# Patient Record
Sex: Male | Born: 1955 | Race: White | Hispanic: No | Marital: Married | Smoking: Never smoker
Health system: Southern US, Community
[De-identification: ages and names within clinical notes are randomized; demographics above are authoritative.]

## PROBLEM LIST (undated history)

## (undated) DIAGNOSIS — Z9889 Other specified postprocedural states: Secondary | ICD-10-CM

## (undated) DIAGNOSIS — D510 Vitamin B12 deficiency anemia due to intrinsic factor deficiency: Secondary | ICD-10-CM

## (undated) DIAGNOSIS — D649 Anemia, unspecified: Secondary | ICD-10-CM

## (undated) DIAGNOSIS — D759 Disease of blood and blood-forming organs, unspecified: Secondary | ICD-10-CM

## (undated) DIAGNOSIS — Z8719 Personal history of other diseases of the digestive system: Secondary | ICD-10-CM

## (undated) DIAGNOSIS — I8500 Esophageal varices without bleeding: Secondary | ICD-10-CM

## (undated) DIAGNOSIS — F1910 Other psychoactive substance abuse, uncomplicated: Secondary | ICD-10-CM

## (undated) DIAGNOSIS — Z9089 Acquired absence of other organs: Secondary | ICD-10-CM

## (undated) HISTORY — DX: Vitamin B12 deficiency anemia due to intrinsic factor deficiency: D51.0

## (undated) HISTORY — PX: LAMINECTOMY: SHX219

## (undated) HISTORY — DX: Other specified postprocedural states: Z98.890

## (undated) HISTORY — DX: Personal history of other diseases of the digestive system: Z87.19

## (undated) HISTORY — PX: TONSILLECTOMY: SUR1361

## (undated) HISTORY — DX: Acquired absence of other organs: Z90.89

---
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
## 1985-06-05 HISTORY — PX: INGUINAL HERNIA REPAIR: SHX194

## 2005-06-12 ENCOUNTER — Encounter: Admission: RE | Admit: 2005-06-12 | Discharge: 2005-06-12 | Payer: Self-pay | Admitting: Orthopedic Surgery

## 2008-06-05 HISTORY — PX: LAMINECTOMY: SHX219

## 2009-02-18 ENCOUNTER — Encounter: Admission: RE | Admit: 2009-02-18 | Discharge: 2009-02-18 | Payer: Self-pay | Admitting: Chiropractic Medicine

## 2009-10-30 ENCOUNTER — Ambulatory Visit (HOSPITAL_COMMUNITY): Admission: RE | Admit: 2009-10-30 | Discharge: 2009-10-30 | Payer: Self-pay | Admitting: Family Medicine

## 2011-11-30 ENCOUNTER — Other Ambulatory Visit: Payer: Self-pay | Admitting: Gastroenterology

## 2011-11-30 DIAGNOSIS — R1310 Dysphagia, unspecified: Secondary | ICD-10-CM

## 2011-12-11 ENCOUNTER — Ambulatory Visit
Admission: RE | Admit: 2011-12-11 | Discharge: 2011-12-11 | Disposition: A | Discharge status: Home / Self Care | Payer: BC Managed Care – PPO | Source: Ambulatory Visit | Attending: Gastroenterology | Admitting: Gastroenterology

## 2011-12-11 DIAGNOSIS — R1310 Dysphagia, unspecified: Secondary | ICD-10-CM

## 2013-12-10 IMAGING — RF DG ESOPHAGUS
13 of 15 series · 19 of 24 positions shown · non-contrast
Comparison: None.

CLINICAL DATA: Intermittent dysphagia.

ESOPHOGRAM / BARIUM SWALLOW / BARIUM TABLET STUDY
TECHNIQUE: Combined double contrast and single contrast
examination performed using effervescent crystals, thick barium
liquid, and thin barium liquid.  The patient was observed with
fluoroscopy swallowing a 13mm barium sulphate tablet.
Fluoroscopy time:  1.1 minutes.

[Series 1: run · 4 of 8 slices shown (1 of 13)]
[im 1/8]
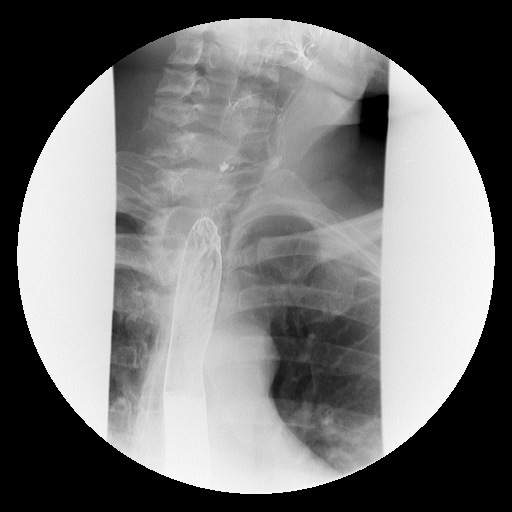
[im 2/8]
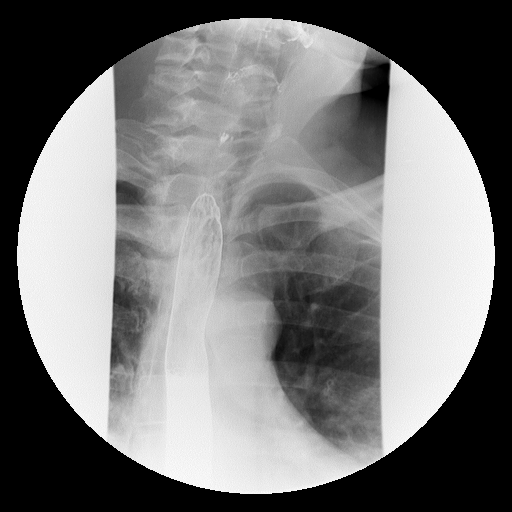
[im 6/8]
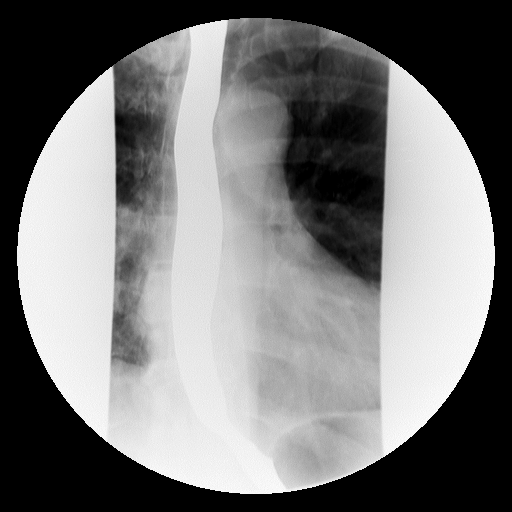
[im 8/8]
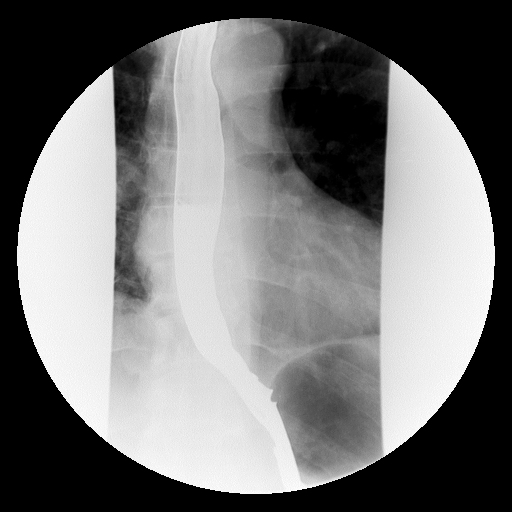

[Series 2: run · 2 of 5 slices shown (2 of 13)]
[im 1/5]
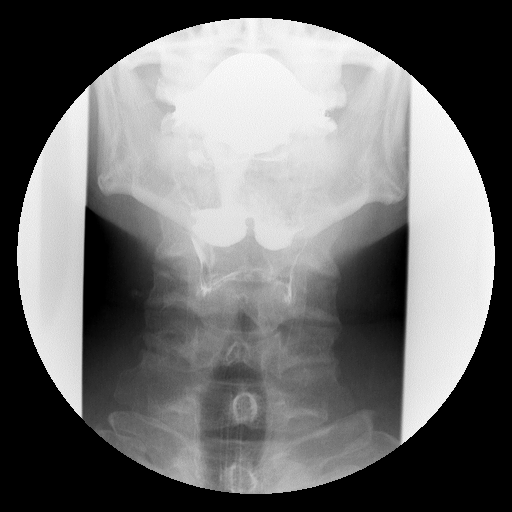
[im 3/5]
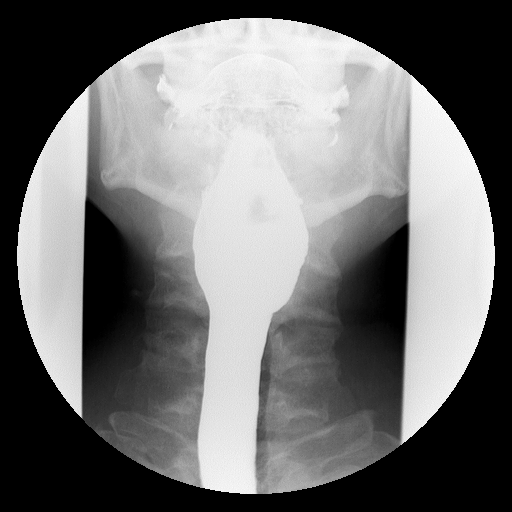

[Series 3: run · 3 of 5 slices shown (3 of 13)]
[im 1/5]
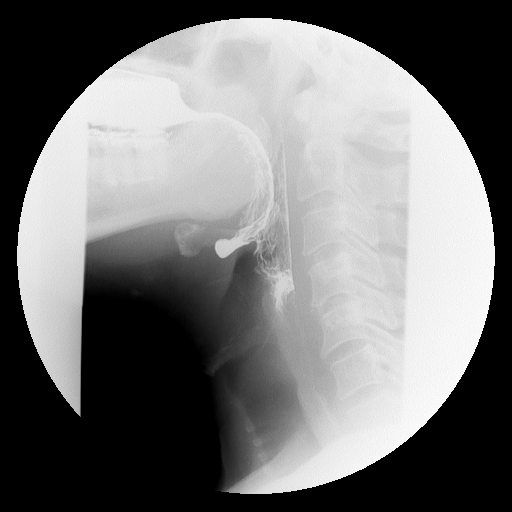
[im 2/5]
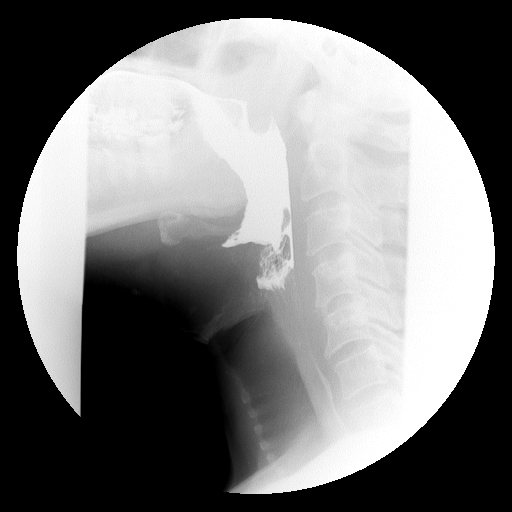
[im 3/5]
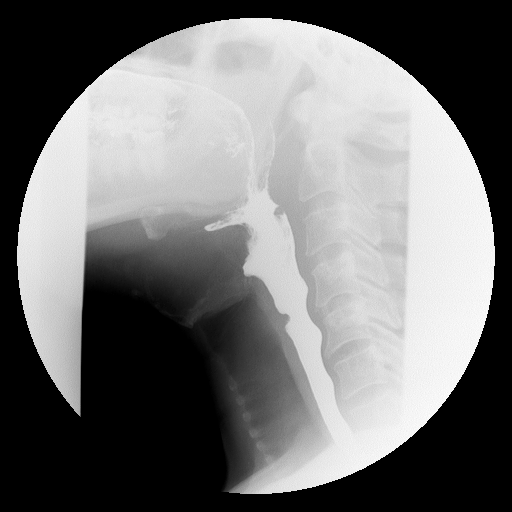

[Series 4: run · 1 of 1 slices shown (4 of 13)]
[im 1/1]
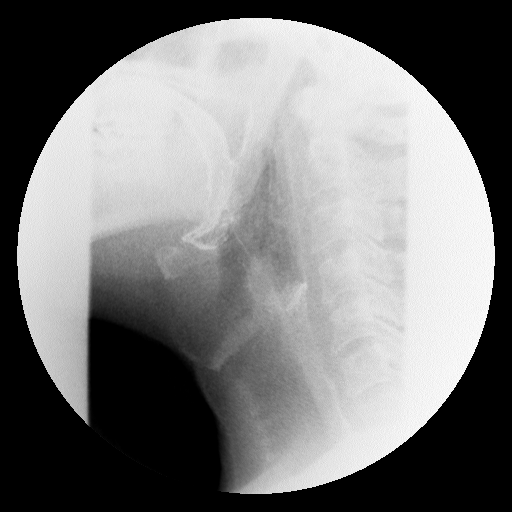

[Series 5: run · 1 of 1 slices shown (5 of 13)]
[im 1/1]
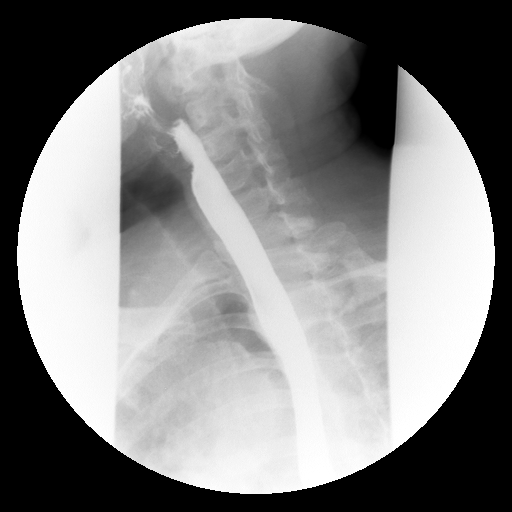

[Series 6: run · 1 of 1 slices shown (6 of 13)]
[im 1/1]
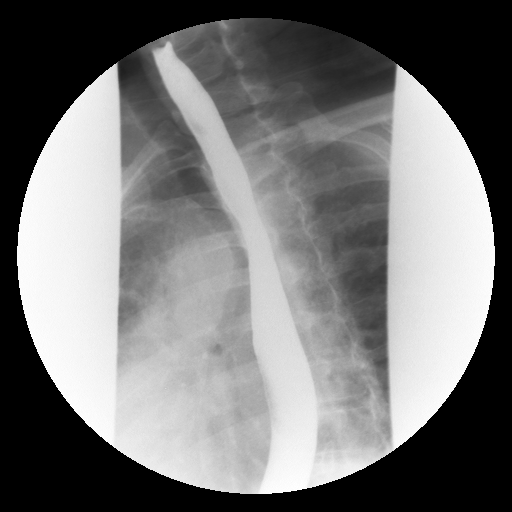

[Series 7: run · 1 of 1 slices shown (7 of 13)]
[im 1/1]
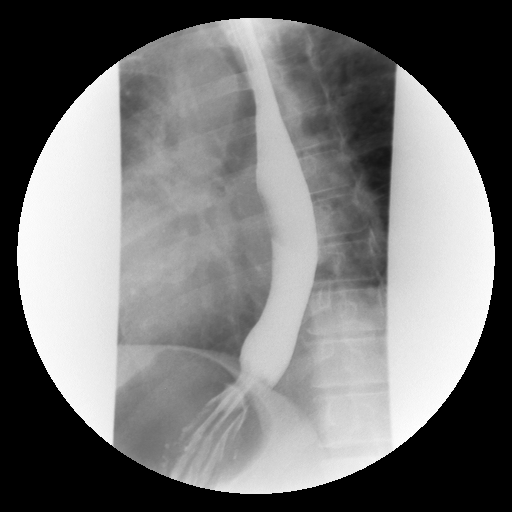

[Series 9: run · 1 of 1 slices shown (8 of 13)]
[im 1/1]
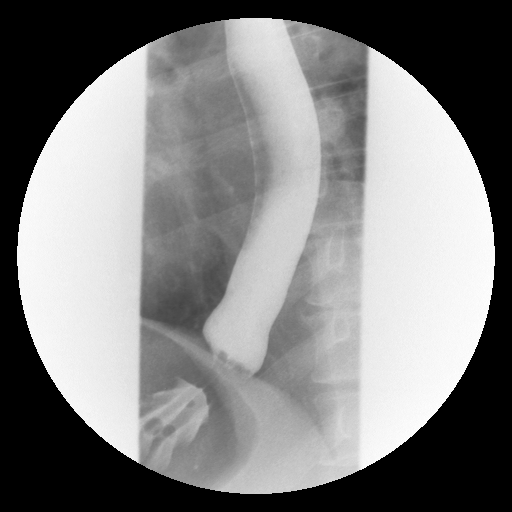

[Series 10: run · 1 of 1 slices shown (9 of 13)]
[im 1/1]
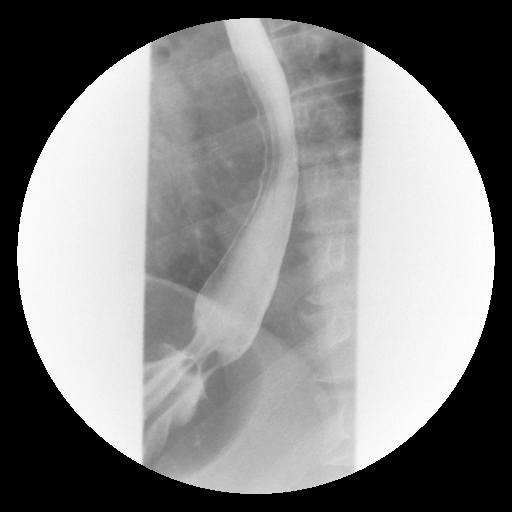

[Series 11: run · 1 of 1 slices shown (10 of 13)]
[im 1/1]
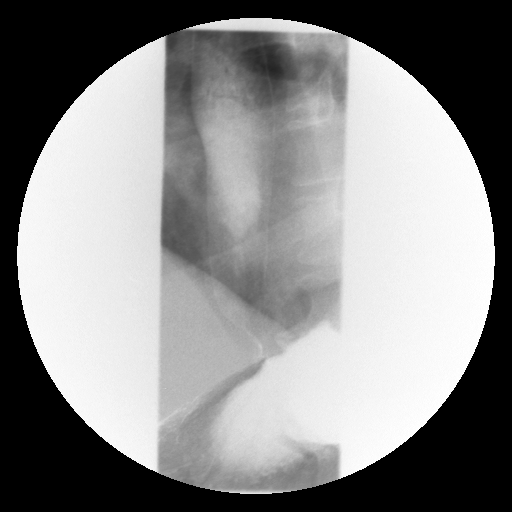

[Series 12: run · 1 of 1 slices shown (11 of 13)]
[im 1/1]
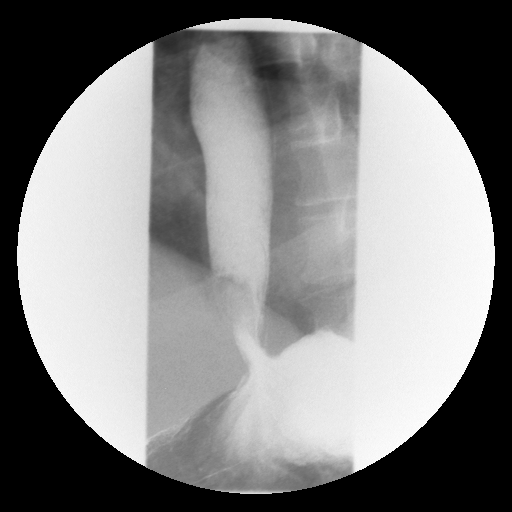

[Series 14: run · 1 of 1 slices shown (12 of 13)]
[im 1/1]
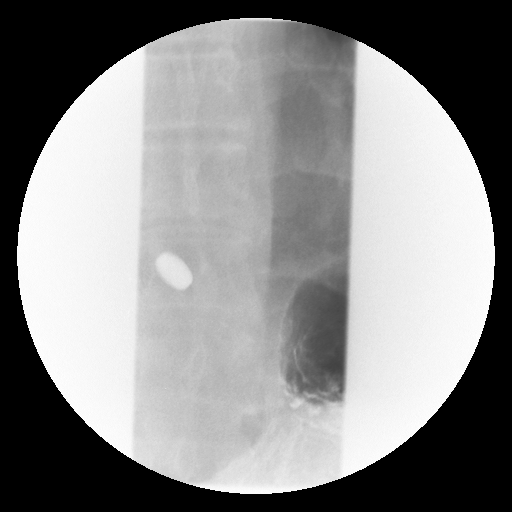

[Series 15: run · 1 of 1 slices shown (13 of 13)]
[im 1/1]
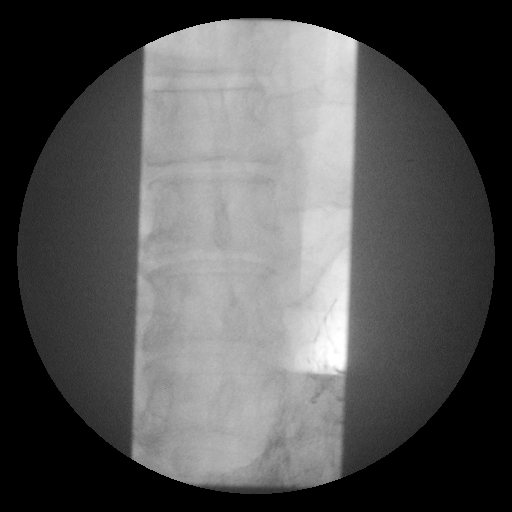

[19 of 24 positions shown; findings below may reference images not displayed]

FINDINGS: The esophageal motility is normal.  There is no
stricture, mass or ulceration.  Rapid sequence imaging of the
pharynx in the AP and lateral projections demonstrates no mucosal
abnormalities.

There is no significant hiatal hernia.  However, moderate reflux to
the level of the carina was elicited with the water siphon test. At
the conclusion of the study, a 13 mm barium tablet was administered
and passed without delayinto the stomach.
IMPRESSION: 1.  Moderate gastroesophageal reflux.  No evidence of esophagitis
or stricture.
2.  No mucosal lesions identified.

## 2015-08-16 ENCOUNTER — Ambulatory Visit (INDEPENDENT_AMBULATORY_CARE_PROVIDER_SITE_OTHER): Payer: Managed Care, Other (non HMO) | Admitting: Neurology

## 2015-08-16 ENCOUNTER — Encounter: Payer: Self-pay | Admitting: Neurology

## 2015-08-16 ENCOUNTER — Other Ambulatory Visit (INDEPENDENT_AMBULATORY_CARE_PROVIDER_SITE_OTHER): Payer: Managed Care, Other (non HMO)

## 2015-08-16 VITALS — BP 140/90 | HR 92 | Ht 68.0 in | Wt 209.3 lb

## 2015-08-16 DIAGNOSIS — R202 Paresthesia of skin: Secondary | ICD-10-CM | POA: Diagnosis not present

## 2015-08-16 DIAGNOSIS — G5713 Meralgia paresthetica, bilateral lower limbs: Secondary | ICD-10-CM | POA: Diagnosis not present

## 2015-08-16 DIAGNOSIS — Z7289 Other problems related to lifestyle: Secondary | ICD-10-CM | POA: Insufficient documentation

## 2015-08-16 DIAGNOSIS — Z789 Other specified health status: Secondary | ICD-10-CM

## 2015-08-16 DIAGNOSIS — F1090 Alcohol use, unspecified, uncomplicated: Secondary | ICD-10-CM

## 2015-08-16 LAB — VITAMIN B12: VITAMIN B 12: 444 pg/mL (ref 211–911)

## 2015-08-16 NOTE — Progress Notes (Signed)
[facility] Clinic Note - Initial Visit   Date: 08/16/2015  John Fuentes MRN: [id number] DOB: 11-23-1955   Dear Dr. Laurann Montana:  Thank you for your kind referral of John Fuentes for consultation of bilateral leg paresthesias. Although his history is well known to you, please allow [name] to reiterate it for the purpose of our medical record. The patient was accompanied to the clinic by self.    History of Present Illness: John Fuentes is a 60 y.o. right-handed Caucasian male with pernicious anemia presenting for evaluation of bilateral leg paresthesias.    Starting around ~2005, he began having right foot discomfort at the ball and heel of the foot.  He has intermittent numbness of the foot and episodic sharp shooting pain.  It is triggered by heavy lifting and alleviated by laying supine.  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  He continued to do PT without benefit.  He does not have weakness of the foot.    He has left anterolateral thigh burning which has been present since his surgery and since 2015, he began noticing the same symptoms over the right thigh. It does not  He has gained 30lb over the past 7 years.  He denies having weakness, falls, or imbalance.    He endorses drinking 4-5 drinks daily for the past 5 years.  Out-side paper records, electronic medical record, and images have been reviewed where available and summarized as:  MRI lumbar spine wo contrast 02/18/2009: 1. Interval enlargement of right paracentral disc protrusion at L4-L5 with resulting right L5 nerve root encroachment in the lateral recess. 2. Stable central disc protrusion at L3-L4 resulting in mild central and left greater than right lateral recess stenosis. 3. Broad-based lateral foraminal disc protrusion on the right at  L1-L2 appears slightly larger and may result in extraforaminal right L1 nerve root encroachment.  Past Medical History  Diagnosis Date  . Pernicious anemia   . Status post tonsillectomy   . History of hernia surgery     Past Surgical History  Procedure Laterality Date  . Laminectomy    . Tonsillectomy       Medications:  Outpatient Encounter Prescriptions as of 08/16/2015  Medication Sig Note  . cyanocobalamin (,VITAMIN B-12,) 1000 MCG/ML injection INJECT 1 MILLILITER ONCE A MONTH AS DIRECTED 08/16/2015: Received from: External Pharmacy  . naproxen sodium (ANAPROX) 220 MG tablet Take 220 mg by mouth 2 (two) times daily with a meal.   . SAFETY-LOK 3CC SYR 25GX5/8" 25G X 5/8" 3 ML MISC USE AS DIRECTED ONCE A MONTH FOR VITAMIN B12 08/16/2015: Received from: External Pharmacy   No facility-administered encounter medications on file as of 08/16/2015.     Allergies: Allergies no known allergies  Family History: Family History  Problem Relation Age of Onset  . Ulcerative colitis Mother   . Diabetes Brother     Social History: Social History  Substance Use Topics  . Smoking status: Never Smoker   . Smokeless tobacco: Never Used  . Alcohol Use: 0.0 oz/week    0 Standard drinks or equivalent per week   Social History   Social History Narrative   Lives with wife in a 2 story home.  Has 3 daughters.  Works at [employer].  Education: college.     Review of Systems:  CONSTITUTIONAL: No fevers,  chills, night sweats, or weight loss.  EYES: No visual changes or eye pain ENT: No hearing changes.  No history of nose bleeds.   RESPIRATORY: No cough, wheezing and shortness of breath.   CARDIOVASCULAR: Negative for chest pain, and palpitations.   GI: Negative for abdominal discomfort, blood in stools or black stools.  No recent change in bowel habits.   GU:  No history of incontinence.   MUSCLOSKELETAL: No history of joint pain or swelling.  No myalgias.   SKIN: Negative for  lesions, rash, and itching.   HEMATOLOGY/ONCOLOGY: Negative for prolonged bleeding, bruising easily, and swollen nodes.  No history of cancer.   ENDOCRINE: Negative for cold or heat intolerance, polydipsia or goiter.   PSYCH:  No depression or anxiety symptoms.   NEURO: As Above.   Vital Signs:  BP 140/90 mmHg  Pulse 92  Ht 5\' 8"  (1.727 m)  Wt 209 lb 5 oz (94.944 kg)  BMI 31.83 kg/m2  SpO2 98% Pain Scale: 0 on a scale of 0-10   General Medical Exam:   General:  Well appearing, comfortable.   Eyes/ENT: see cranial nerve examination.   Neck: No masses appreciated.  Full range of motion without tenderness.  No carotid bruits. Respiratory:  Clear to auscultation, good air entry bilaterally.   Cardiac:  Regular rate and rhythm, no murmur.   Extremities:  No deformities, edema, or skin discoloration.  Skin:  No rashes or lesions.  Neurological Exam: MENTAL STATUS including orientation to time, place, person, recent and remote memory, attention span and concentration, language, and fund of knowledge is normal.  Speech is not dysarthric.  CRANIAL NERVES: II:  No visual field defects.  Unremarkable fundi.   III-IV-VI: Pupils equal round and reactive to light.  Normal conjugate, extra-ocular eye movements in all directions of gaze.  No nystagmus.  No ptosis.   V:  Normal facial sensation.   VII:  Normal facial symmetry and movements.  VIII:  Normal hearing and vestibular function.   IX-X:  Normal palatal movement.   XI:  Normal shoulder shrug and head rotation.   XII:  Normal tongue strength and range of motion, no deviation or fasciculation.  MOTOR:  No atrophy, fasciculations or abnormal movements.  No pronator drift.  Tone is normal.    Right Upper Extremity:    Left Upper Extremity:    Deltoid  5/5   Deltoid  5/5   Biceps  5/5   Biceps  5/5   Triceps  5/5   Triceps  5/5   Wrist extensors  5/5   Wrist extensors  5/5   Wrist flexors  5/5   Wrist flexors  5/5   Finger extensors   5/5   Finger extensors  5/5   Finger flexors  5/5   Finger flexors  5/5   Dorsal interossei  5/5   Dorsal interossei  5/5   Abductor pollicis  5/5   Abductor pollicis  5/5   Tone (Ashworth scale)  0  Tone (Ashworth scale)  0   Right Lower Extremity:    Left Lower Extremity:    Hip flexors  5/5   Hip flexors  5/5   Hip extensors  5/5   Hip extensors  5/5   Knee flexors  5/5   Knee flexors  5/5   Knee extensors  5/5   Knee extensors  5/5   Dorsiflexors  5/5   Dorsiflexors  5/5   Plantarflexors  5/5   Plantarflexors  5/5   Toe extensors  5/5   Toe extensors  5/5   Toe flexors  5-/5   Toe flexors  5-/5   Tone (Ashworth scale)  0  Tone (Ashworth scale)  0   MSRs:  Right                                                                 Left brachioradialis 2+  brachioradialis 2+  biceps 2+  biceps 2+  triceps 2+  triceps 2+  patellar 3+  patellar 3+  ankle jerk 2+  ankle jerk 2+  Hoffman no  Hoffman no  plantar response down  plantar response down   SENSORY:  Reduced sensation to light touch and temperature over the distribution of the lateral femoral cutaneous.  Normal and symmetric perception of light touch, pinprick, vibration, and proprioception in the feet and upper extremities.     COORDINATION/GAIT: Normal finger-to- nose-finger and heel-to-shin.  Intact rapid alternating movements bilaterally.  Able to rise from a chair without using arms.  Gait narrow based and stable. Tandem and stressed gait intact.    IMPRESSION: 1.  Bilateral meralgia paresthetica most likely due to weight gain of >30lb over the past few years 2.  Right foot paresthesias over the sole.  Discussed this could represent S1 radiculopathy vs early peripheral neuropathy but the latter would usually be symmetrical.  Tarsal tunnel syndrome is another possibility.  NCS/EMG of the foot will be performed to better localize symptoms.   3.  Alcohol abuse  PLAN/RECOMMENDATIONS:  1.  NCS/EMG of the right > left 2.  Check  thiamine level 3.  Advised to cut back on alcohol level 4.  Encouraged weight loss  Return to clinic in 4 months.   The duration of this appointment visit was 45 minutes of face-to-face time with the patient.  Greater than 50% of this time was spent in counseling, explanation of diagnosis, planning of further management, and coordination of care.   Thank you for allowing me to participate in patient's care.  If I can answer any additional questions, I would be pleased to do so.    Sincerely,    Chastity Noland K. Posey Pronto, DO

## 2015-08-16 NOTE — Patient Instructions (Addendum)
1.  NCS/EMG of the lower extremities 2.  Check thiamine level 3.  Advised to cut back on alcohol level 4.  Encouraged weight loss   Return to clinic in 4 months

## 2015-08-16 NOTE — Progress Notes (Signed)
Note routed

## 2015-08-20 ENCOUNTER — Telehealth: Payer: Self-pay | Admitting: Neurology

## 2015-08-20 LAB — VITAMIN B1: VITAMIN B1 (THIAMINE): 6 nmol/L — AB (ref 8–30)

## 2015-08-20 NOTE — Telephone Encounter (Signed)
Pt wants the results of the blood work please call [phone]

## 2015-08-23 NOTE — Telephone Encounter (Signed)
See result note.  

## 2015-08-31 ENCOUNTER — Encounter: Payer: Managed Care, Other (non HMO) | Admitting: Neurology

## 2015-12-16 ENCOUNTER — Ambulatory Visit: Payer: Managed Care, Other (non HMO) | Admitting: Neurology

## 2019-08-20 ENCOUNTER — Ambulatory Visit
Admission: RE | Admit: 2019-08-20 | Discharge: 2019-08-20 | Disposition: A | Discharge status: Home / Self Care | Payer: 59 | Source: Ambulatory Visit | Attending: Internal Medicine | Admitting: Internal Medicine

## 2019-08-20 ENCOUNTER — Other Ambulatory Visit: Payer: Self-pay | Admitting: Internal Medicine

## 2019-08-20 DIAGNOSIS — R059 Cough, unspecified: Secondary | ICD-10-CM

## 2019-08-20 DIAGNOSIS — R05 Cough: Secondary | ICD-10-CM

## 2019-08-22 ENCOUNTER — Other Ambulatory Visit: Payer: Self-pay | Admitting: Internal Medicine

## 2019-08-22 DIAGNOSIS — R7989 Other specified abnormal findings of blood chemistry: Secondary | ICD-10-CM

## 2019-08-25 ENCOUNTER — Ambulatory Visit
Admission: RE | Admit: 2019-08-25 | Discharge: 2019-08-25 | Disposition: A | Discharge status: Home / Self Care | Payer: 59 | Source: Ambulatory Visit | Attending: Internal Medicine | Admitting: Internal Medicine

## 2019-08-25 DIAGNOSIS — R7989 Other specified abnormal findings of blood chemistry: Secondary | ICD-10-CM

## 2019-08-26 ENCOUNTER — Other Ambulatory Visit: Payer: Self-pay | Admitting: Internal Medicine

## 2019-12-24 ENCOUNTER — Other Ambulatory Visit: Payer: Self-pay | Admitting: *Deleted

## 2019-12-24 ENCOUNTER — Ambulatory Visit: Payer: 59 | Attending: Internal Medicine

## 2019-12-24 DIAGNOSIS — Z20822 Contact with and (suspected) exposure to covid-19: Secondary | ICD-10-CM

## 2019-12-25 LAB — NOVEL CORONAVIRUS, NAA: SARS-CoV-2, NAA: NOT DETECTED

## 2019-12-25 LAB — SARS-COV-2, NAA 2 DAY TAT

## 2021-08-19 ENCOUNTER — Other Ambulatory Visit: Payer: Self-pay | Admitting: Internal Medicine

## 2021-08-19 DIAGNOSIS — K769 Liver disease, unspecified: Secondary | ICD-10-CM

## 2021-08-19 IMAGING — DX DG CHEST 2V
2 series · 2 of 2 positions shown · non-contrast
Comparison: None.

CLINICAL DATA: Cough

EXAM:
CHEST - 2 VIEW

[dg chest 2 view (1 of 2)]
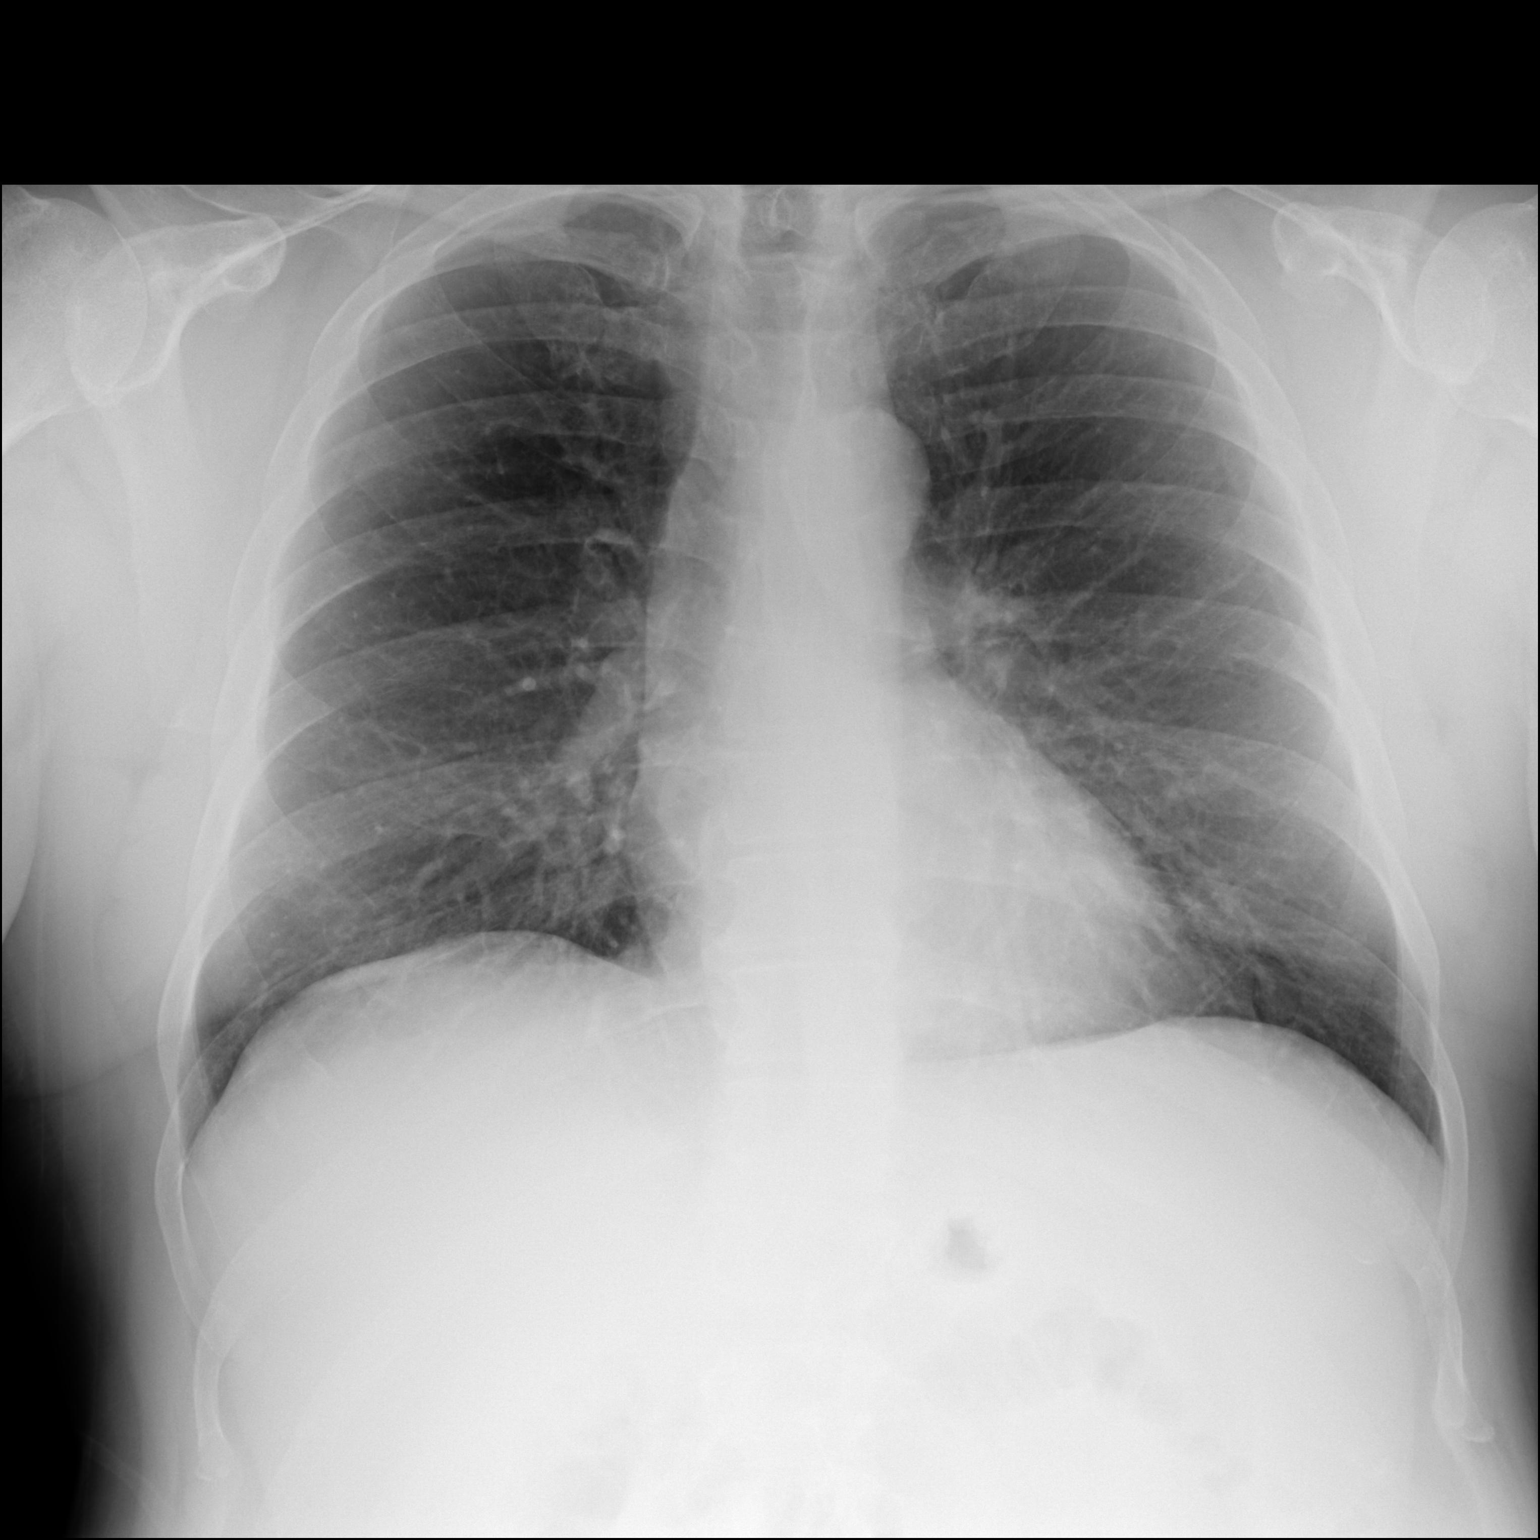

[dg chest 2 view (2 of 2)]
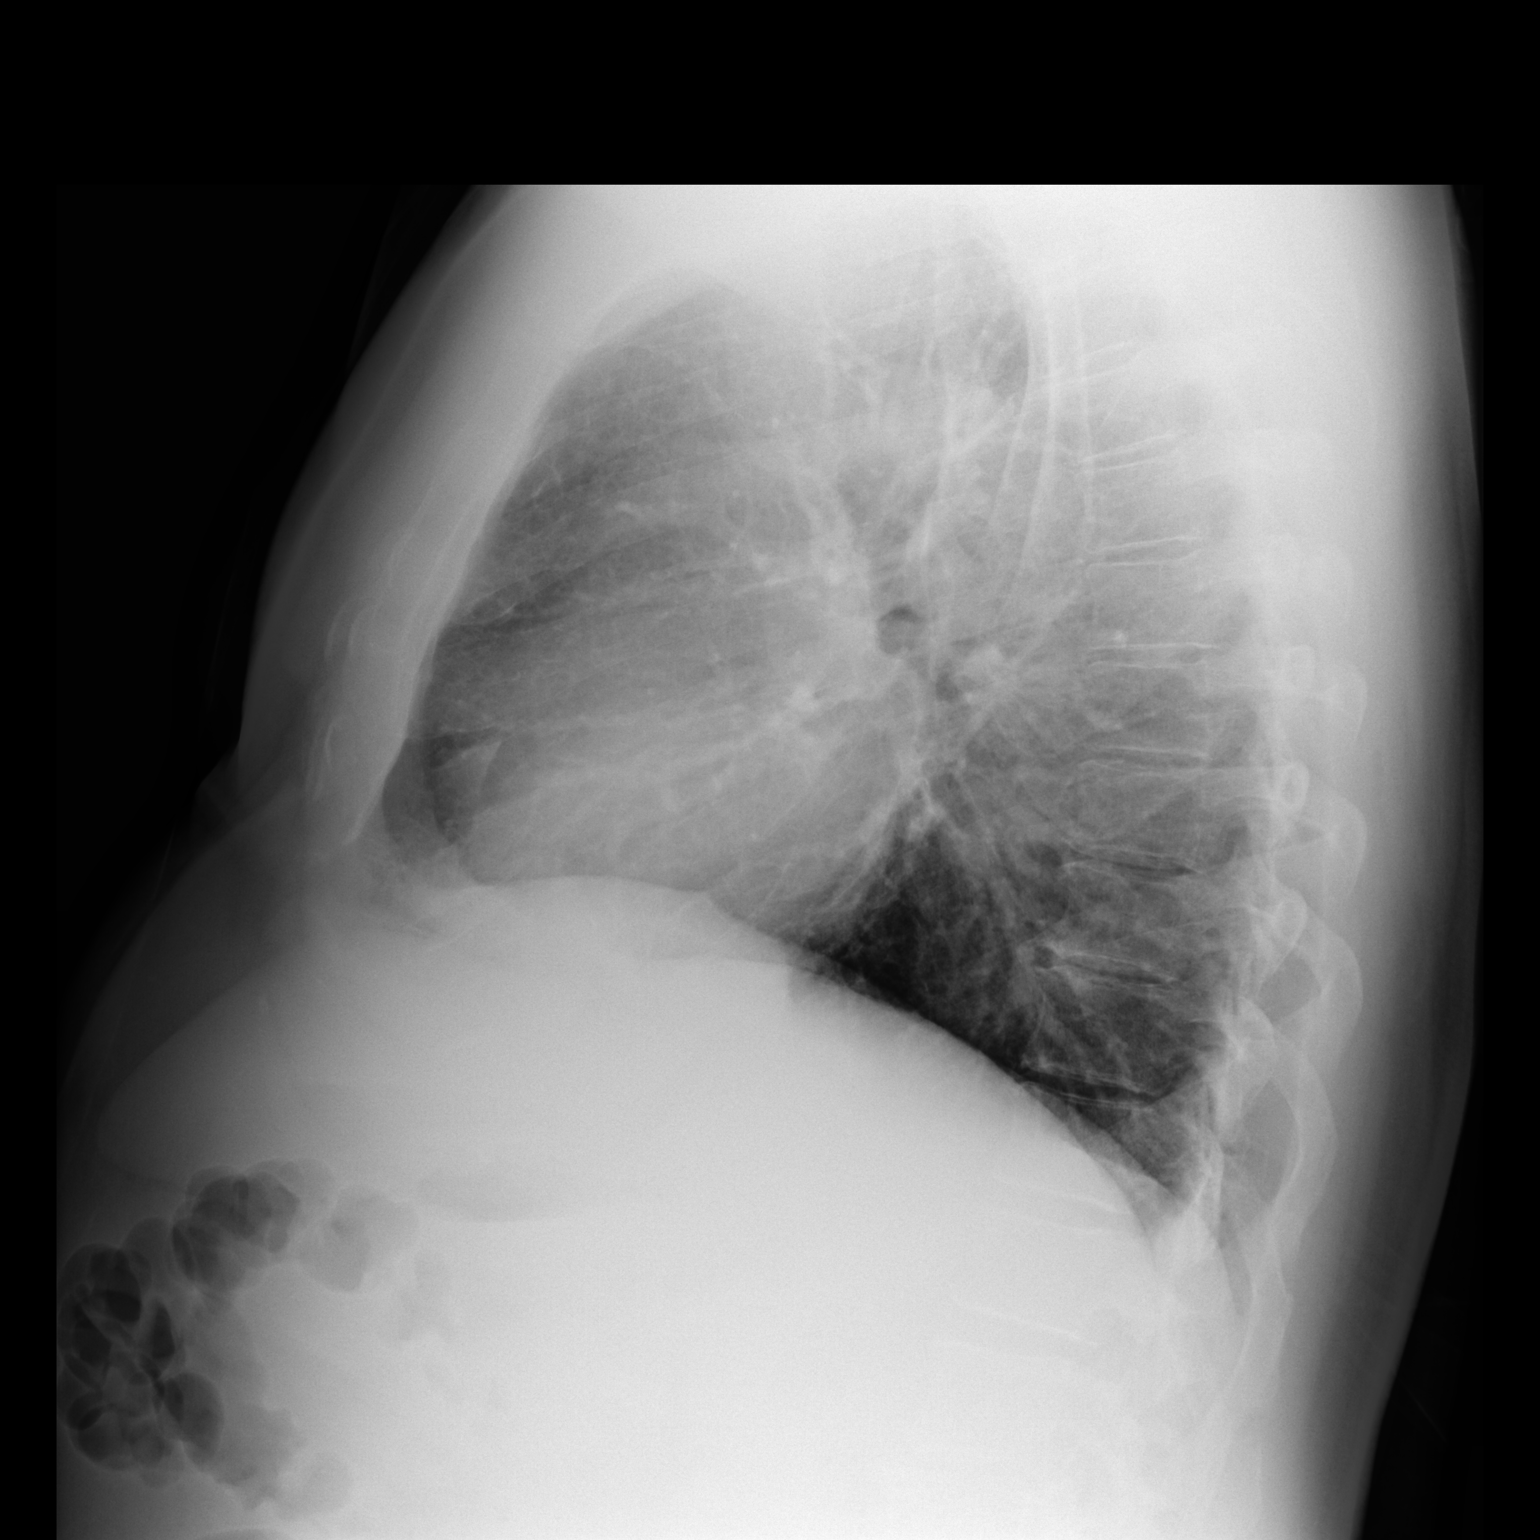

[2 of 2 positions shown; findings below may reference images not displayed]

FINDINGS: The heart size and mediastinal contours are within normal limits.
Both lungs are clear. The visualized skeletal structures are
unremarkable.
IMPRESSION: No active cardiopulmonary disease.

## 2021-08-24 IMAGING — US US ABDOMEN COMPLETE
1 series · 13 of 25 positions shown · non-contrast
Comparison: None.

CLINICAL DATA: Elevated liver enzymes

EXAM:
ABDOMEN ULTRASOUND COMPLETE

[Series 1: us abdomen complete · 0.20mm/px · 13 of 79 slices shown]
[im 1/79]
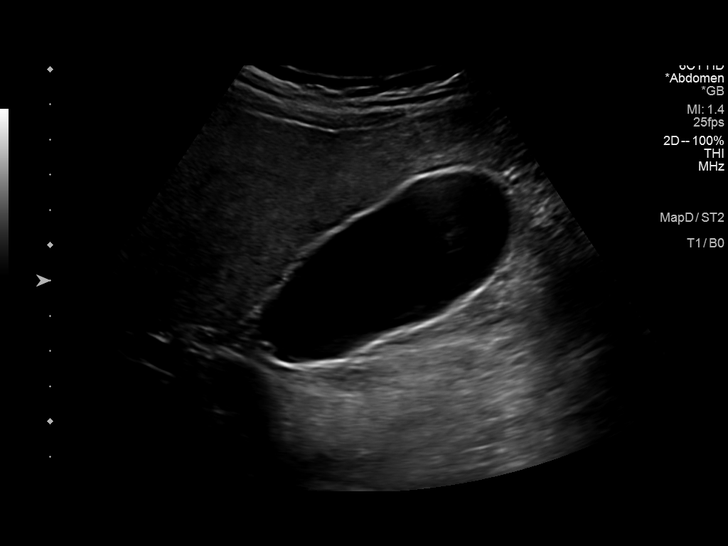
[im 7/79]
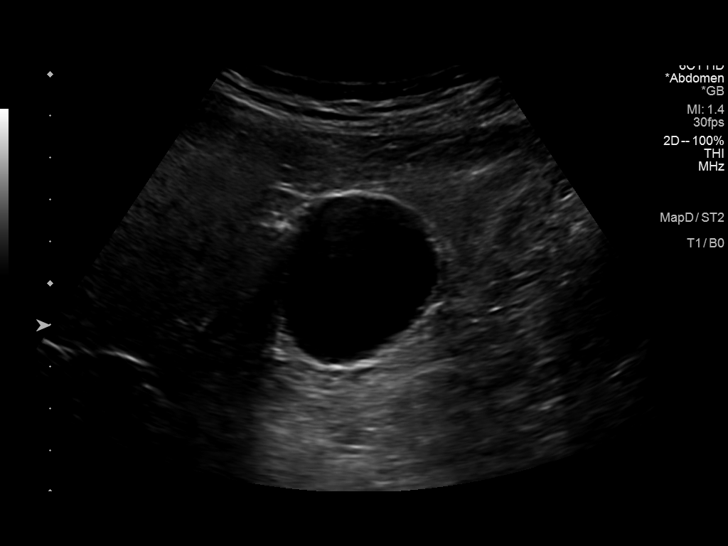
[im 14/79]
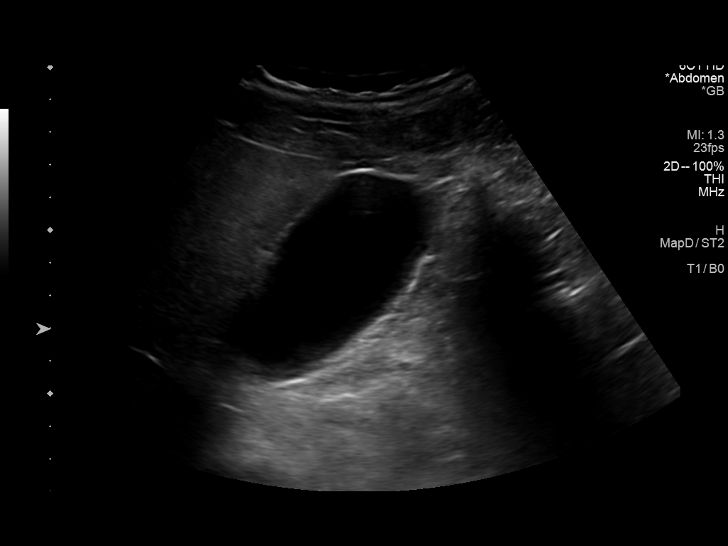
[im 20/79]
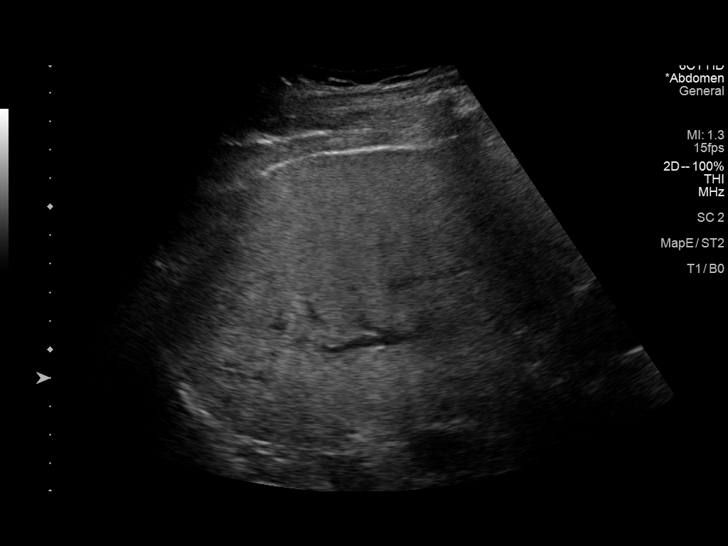
[im 27/79]
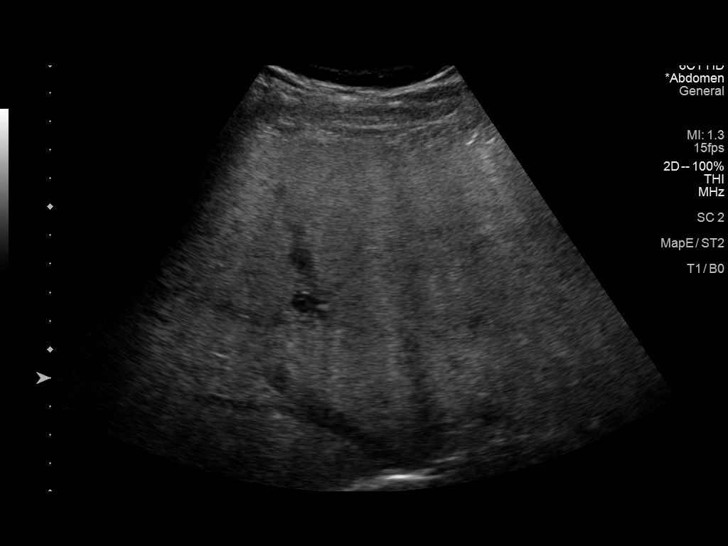
[im 33/79]
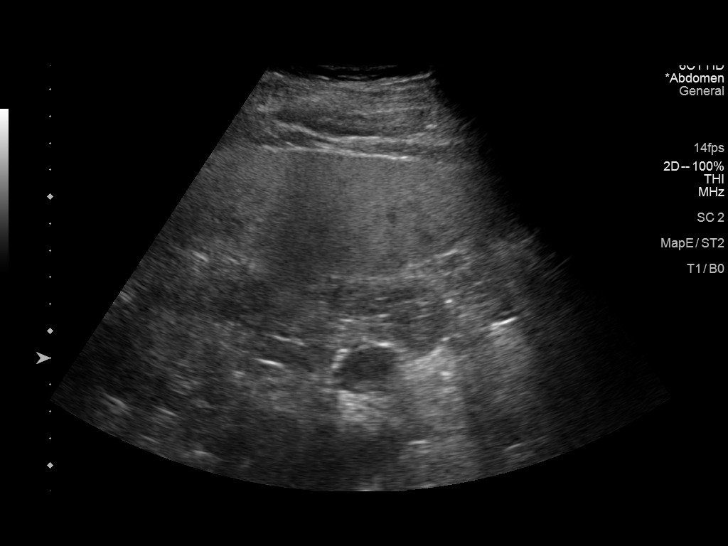
[im 40/79]
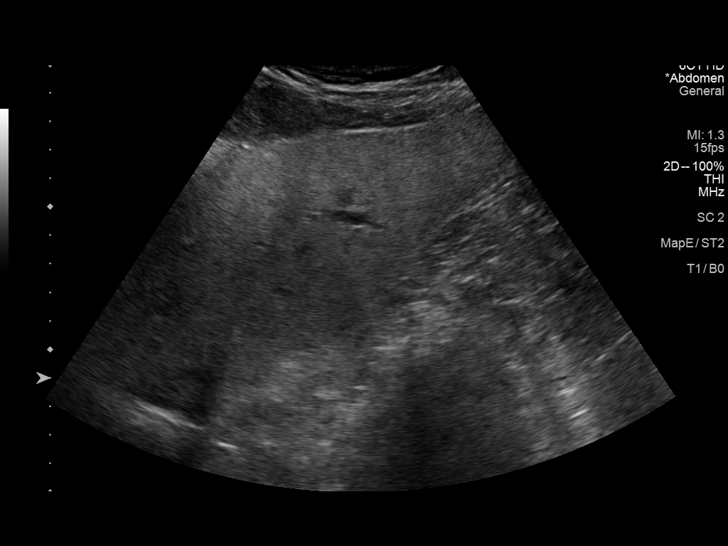
[im 46/79]
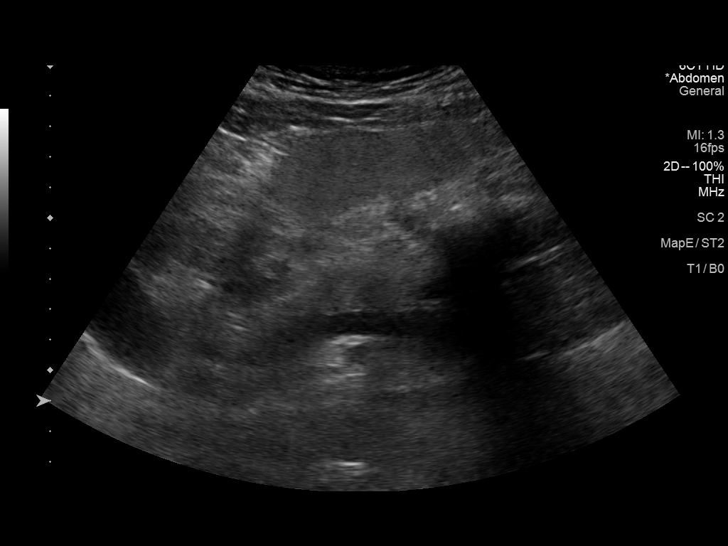
[im 53/79]
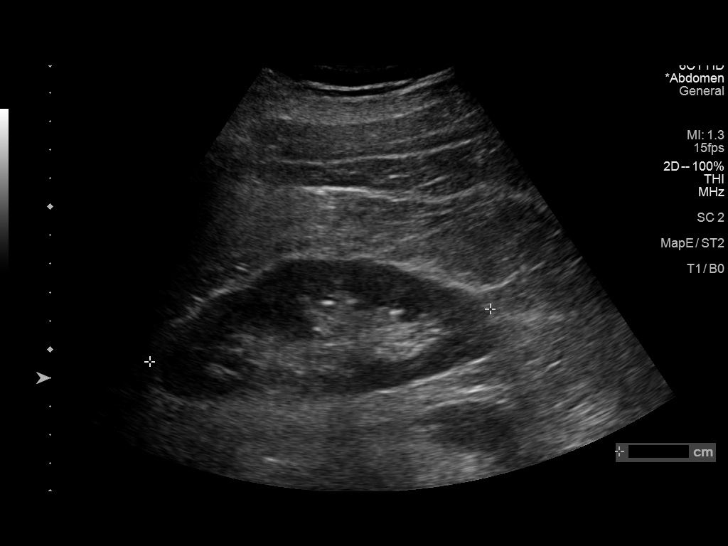
[im 59/79]
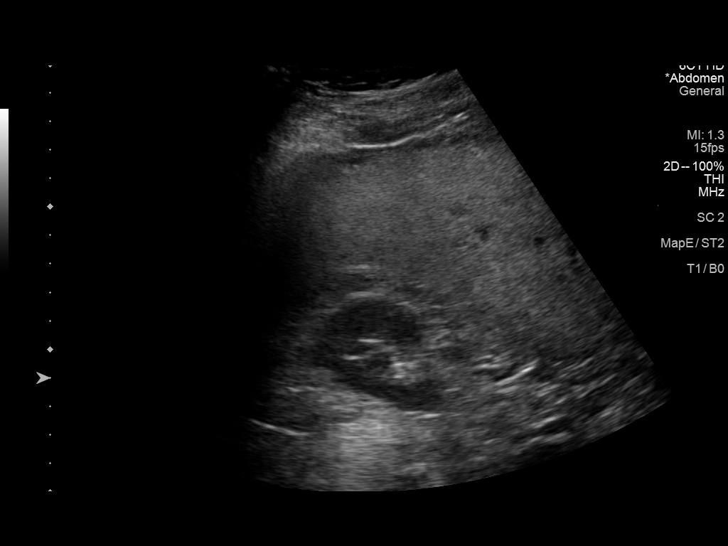
[im 66/79]
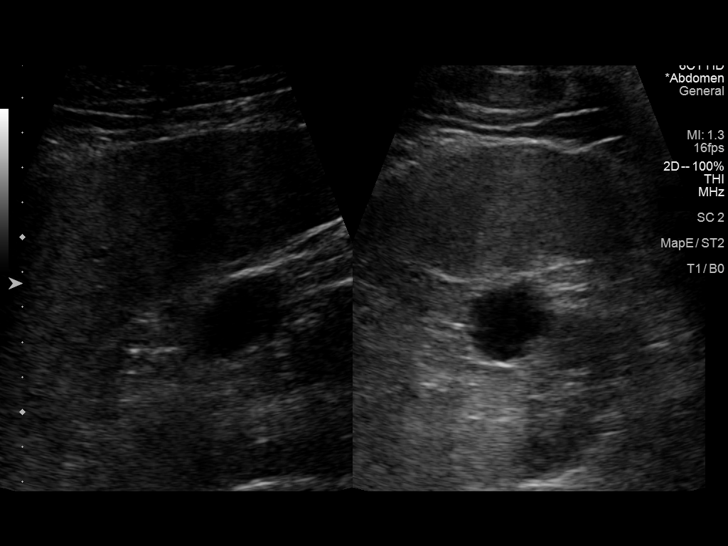
[im 72/79]
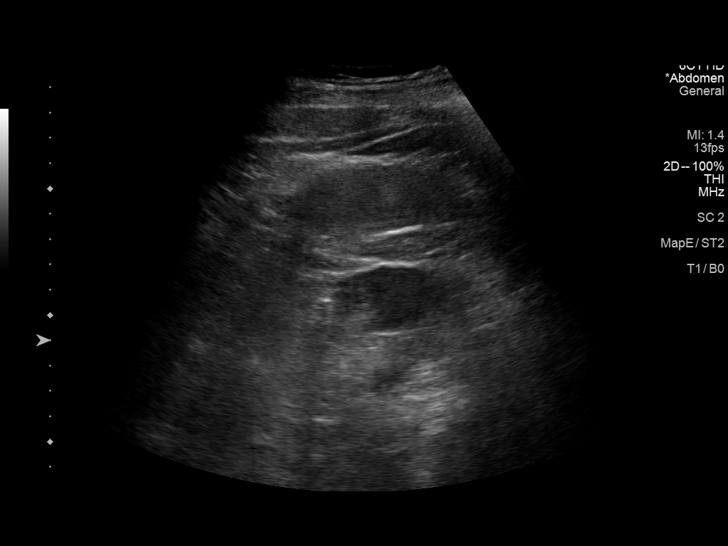
[im 79/79]
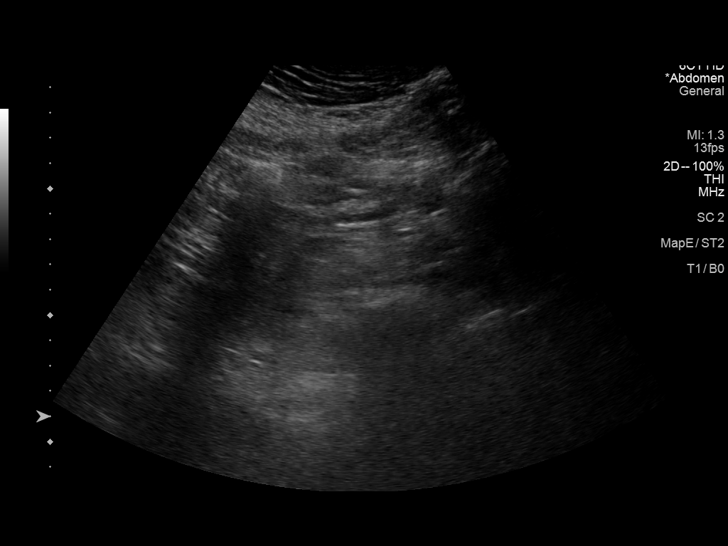

[13 of 25 positions shown; findings below may reference images not displayed]

FINDINGS: Gallbladder: No gallstones or wall thickening visualized. There is
no pericholecystic fluid. No sonographic Murphy sign noted by
sonographer.

Common bile duct: Diameter: 5 mm. No intrahepatic or extrahepatic
biliary duct dilatation.

Liver: No focal lesion identified. Liver echotexture is overall
increased. Portal vein is patent on color Doppler imaging with
normal direction of blood flow towards the liver.

IVC: No abnormality visualized in regions which can be interrogated.
Most of the inferior vena cava is obscured by gas.

Pancreas: Visualized portion unremarkable. Much of pancreas obscured
by gas.

Spleen: Spleen measures 15.3 x 16.7 x 6.7 cm with a splenic volume
measurement of 889 cubic cm. No focal splenic lesions are evident.

Right Kidney: Length: 12.1 cm. Echogenicity within normal limits. No
mass or hydronephrosis visualized.

Left Kidney: Length: 12.5 cm. Echogenicity within normal limits. No
hydronephrosis visualized. There is a cyst in the upper pole left
kidney measuring 3.2 x 2.0 x 2.6 cm.

Abdominal aorta: No aneurysm visualized in regions visualized.
Portions of aorta obscured by gas.

Other findings: No evident ascites.
IMPRESSION: 1. Liver echogenicity overall is increased, an appearance consistent
with hepatic steatosis and potential underlying parenchymal liver
disease. No focal liver lesions are evident; it must be cautioned
that the sensitivity of ultrasound for detection of focal liver
lesions is diminished in this circumstance.

2.  Splenic enlargement.  No focal splenic lesions evident.

3. Much of pancreas obscured by gas. Much of inferior vena cava and
aorta obscured by gas.

4.  Cyst upper pole left kidney measuring 3.2 x 2.0 x 2.6 cm.

## 2021-08-29 ENCOUNTER — Other Ambulatory Visit: Payer: Self-pay

## 2021-08-29 ENCOUNTER — Ambulatory Visit: Payer: Self-pay

## 2021-08-29 DIAGNOSIS — K769 Liver disease, unspecified: Secondary | ICD-10-CM

## 2021-11-25 ENCOUNTER — Encounter: Payer: Self-pay | Admitting: Orthopaedic Surgery

## 2021-11-25 ENCOUNTER — Ambulatory Visit: Payer: 59 | Admitting: Orthopaedic Surgery

## 2021-11-25 ENCOUNTER — Ambulatory Visit (INDEPENDENT_AMBULATORY_CARE_PROVIDER_SITE_OTHER): Payer: 59

## 2021-11-25 VITALS — Ht 69.0 in | Wt 175.0 lb

## 2021-11-25 DIAGNOSIS — G8929 Other chronic pain: Secondary | ICD-10-CM

## 2021-11-25 DIAGNOSIS — M25562 Pain in left knee: Secondary | ICD-10-CM

## 2021-12-13 DIAGNOSIS — Z86010 Personal history of colonic polyps: Secondary | ICD-10-CM | POA: Diagnosis not present

## 2021-12-13 DIAGNOSIS — D696 Thrombocytopenia, unspecified: Secondary | ICD-10-CM | POA: Diagnosis not present

## 2021-12-13 DIAGNOSIS — K760 Fatty (change of) liver, not elsewhere classified: Secondary | ICD-10-CM | POA: Diagnosis not present

## 2021-12-13 DIAGNOSIS — Z87898 Personal history of other specified conditions: Secondary | ICD-10-CM | POA: Diagnosis not present

## 2021-12-15 DIAGNOSIS — M25562 Pain in left knee: Secondary | ICD-10-CM | POA: Diagnosis not present

## 2022-01-24 DIAGNOSIS — K319 Disease of stomach and duodenum, unspecified: Secondary | ICD-10-CM | POA: Diagnosis not present

## 2022-01-24 DIAGNOSIS — K6289 Other specified diseases of anus and rectum: Secondary | ICD-10-CM | POA: Diagnosis not present

## 2022-01-24 DIAGNOSIS — K644 Residual hemorrhoidal skin tags: Secondary | ICD-10-CM | POA: Diagnosis not present

## 2022-01-24 DIAGNOSIS — D122 Benign neoplasm of ascending colon: Secondary | ICD-10-CM | POA: Diagnosis not present

## 2022-01-24 DIAGNOSIS — Z09 Encounter for follow-up examination after completed treatment for conditions other than malignant neoplasm: Secondary | ICD-10-CM | POA: Diagnosis not present

## 2022-01-24 DIAGNOSIS — Z86010 Personal history of colonic polyps: Secondary | ICD-10-CM | POA: Diagnosis not present

## 2022-01-24 DIAGNOSIS — K648 Other hemorrhoids: Secondary | ICD-10-CM | POA: Diagnosis not present

## 2022-01-24 DIAGNOSIS — I8500 Esophageal varices without bleeding: Secondary | ICD-10-CM | POA: Diagnosis not present

## 2022-01-24 DIAGNOSIS — K2100 Gastro-esophageal reflux disease with esophagitis, without bleeding: Secondary | ICD-10-CM | POA: Diagnosis not present

## 2022-01-24 DIAGNOSIS — K2971 Gastritis, unspecified, with bleeding: Secondary | ICD-10-CM | POA: Diagnosis not present

## 2022-01-26 DIAGNOSIS — K319 Disease of stomach and duodenum, unspecified: Secondary | ICD-10-CM | POA: Diagnosis not present

## 2022-01-26 DIAGNOSIS — D122 Benign neoplasm of ascending colon: Secondary | ICD-10-CM | POA: Diagnosis not present

## 2022-11-07 ENCOUNTER — Other Ambulatory Visit (HOSPITAL_COMMUNITY): Payer: Self-pay | Admitting: Internal Medicine

## 2022-11-07 DIAGNOSIS — E7800 Pure hypercholesterolemia, unspecified: Secondary | ICD-10-CM

## 2022-12-12 ENCOUNTER — Other Ambulatory Visit (HOSPITAL_COMMUNITY): Payer: Self-pay

## 2022-12-12 ENCOUNTER — Encounter (HOSPITAL_COMMUNITY): Payer: Self-pay

## 2023-09-25 DIAGNOSIS — D2272 Melanocytic nevi of left lower limb, including hip: Secondary | ICD-10-CM | POA: Diagnosis not present

## 2023-09-25 DIAGNOSIS — Z85828 Personal history of other malignant neoplasm of skin: Secondary | ICD-10-CM | POA: Diagnosis not present

## 2023-09-25 DIAGNOSIS — S6010XA Contusion of unspecified finger with damage to nail, initial encounter: Secondary | ICD-10-CM | POA: Diagnosis not present

## 2023-09-25 DIAGNOSIS — L578 Other skin changes due to chronic exposure to nonionizing radiation: Secondary | ICD-10-CM | POA: Diagnosis not present

## 2023-09-25 DIAGNOSIS — K429 Umbilical hernia without obstruction or gangrene: Secondary | ICD-10-CM | POA: Diagnosis not present

## 2023-09-25 DIAGNOSIS — D225 Melanocytic nevi of trunk: Secondary | ICD-10-CM | POA: Diagnosis not present

## 2023-09-25 DIAGNOSIS — L570 Actinic keratosis: Secondary | ICD-10-CM | POA: Diagnosis not present

## 2023-09-25 DIAGNOSIS — L409 Psoriasis, unspecified: Secondary | ICD-10-CM | POA: Diagnosis not present

## 2023-10-29 ENCOUNTER — Other Ambulatory Visit: Payer: Self-pay

## 2023-10-29 ENCOUNTER — Encounter (HOSPITAL_COMMUNITY): Payer: Self-pay | Admitting: Emergency Medicine

## 2023-10-29 ENCOUNTER — Emergency Department (HOSPITAL_COMMUNITY)
Admission: EM | Admit: 2023-10-29 | Discharge: 2023-10-29 | Disposition: A | Discharge status: Home / Self Care | Payer: Self-pay | Attending: Emergency Medicine | Admitting: Emergency Medicine

## 2023-10-29 DIAGNOSIS — H33302 Unspecified retinal break, left eye: Secondary | ICD-10-CM | POA: Diagnosis not present

## 2023-10-29 DIAGNOSIS — H547 Unspecified visual loss: Secondary | ICD-10-CM

## 2023-10-29 DIAGNOSIS — H538 Other visual disturbances: Secondary | ICD-10-CM | POA: Diagnosis not present

## 2023-10-29 DIAGNOSIS — H353131 Nonexudative age-related macular degeneration, bilateral, early dry stage: Secondary | ICD-10-CM | POA: Diagnosis not present

## 2023-10-29 DIAGNOSIS — H2513 Age-related nuclear cataract, bilateral: Secondary | ICD-10-CM | POA: Diagnosis not present

## 2023-10-29 MED ORDER — TETRACAINE HCL 0.5 % OP SOLN
2.0000 [drp] | Freq: Once | OPHTHALMIC | Status: AC
  Administered 2023-10-29: 2 [drp] via OPHTHALMIC
  Filled 2023-10-29: qty 4

## 2023-10-29 MED ORDER — FLUORESCEIN SODIUM 1 MG OP STRP
1.0000 | ORAL_STRIP | Freq: Once | OPHTHALMIC | Status: AC
  Administered 2023-10-29: 1 via OPHTHALMIC
  Filled 2023-10-29: qty 1

## 2023-10-29 NOTE — ED Provider Notes (Signed)
 [facility] Provider Note   CSN: [id number] Arrival date & time: 10/29/23  1143     History Chief Complaint  Patient presents with   Eye Problem    John Fuentes is a 68 y.o. male with h/o migraines with aura, but without headache, presents to the emerged from today for evaluation of vision changes since Friday night around 7 PM.  He reports between 7 PM and 9 PM on Friday night he saw this yellow and blue "gray "on the lateral aspect of his left eye between the 12:00 and 6 "as described by him.  He reports that he was in the living room and was sitting down.  No increase in the light or pain with this.  He reports that also he already or history of floaters in his eye and has been seen by ophthalmology for this and were not concerned for this.  He reports that he feels like the characterization of these change and they feel that they are more dimensional and thicker.  He reports that yesterday on Sunday around 12:00 he noticed a black dot in the around the 3 to 5 o'clock position of the lateral eye that lasted for around 2 hours and then resolved.  He wears glasses but no contacts ever.  He denies any pain to the eye.  Denies any visual changes to his right eye during this encounter.  He denies any visual loss or diplopia.  He reports that now he feels like it is just between o'clock and 5 o'clock position with the right and some spiderweb but was concerned and his PCP sent him over for further evaluation.  He denies any trauma to the eye or face/head.  He reports this does feel different than his or migraines which is usually described as "lightning strikes" and this is more of a "ray".   Eye Problem Associated symptoms: no discharge, no headaches and no photophobia        Home Medications Prior to Admission medications   Medication Sig Start Date End Date Taking? Authorizing Provider  cyanocobalamin (,VITAMIN B-12,) 1000 MCG/ML injection INJECT 1  MILLILITER ONCE A MONTH AS DIRECTED 07/21/15   [provider]  SAFETY-LOK 3CC SYR 25GX5/8" 25G X 5/8" 3 ML MISC USE AS DIRECTED ONCE A MONTH FOR VITAMIN B12 07/24/15   [provider]      Allergies    Patient has no known allergies.    Review of Systems   Review of Systems  Constitutional:  Negative for chills and fever.  Eyes:  Positive for visual disturbance. Negative for photophobia, pain and discharge.  Neurological:  Negative for headaches.    Physical Exam Updated Vital Signs BP (!) 147/85 (BP Location: Right Arm)   Pulse 96   Temp 98.7 F (37.1 C) (Oral)   Resp 18   Ht 5\' 9"  (1.753 m)   Wt 79.4 kg   SpO2 100%   BMI 25.85 kg/m  Physical Exam Vitals and nursing note reviewed.  Constitutional:      General: He is not in acute distress.    Appearance: He is not toxic-appearing.  Eyes:     General: No scleral icterus.    Intraocular pressure: Right eye pressure is 19 mmHg. Left eye pressure is 14 mmHg.     Extraocular Movements:     Right eye: Normal extraocular motion and no nystagmus.     Left eye: Normal extraocular motion and no nystagmus.  Pupils: Pupils are equal, round, and reactive to light.     Visual Fields: Right eye visual fields normal and left eye visual fields normal.  Cardiovascular:     Rate and Rhythm: Normal rate.  Pulmonary:     Effort: Pulmonary effort is normal. No respiratory distress.  Skin:    General: Skin is warm and dry.  Neurological:     General: No focal deficit present.     Mental Status: He is alert.     Cranial Nerves: No cranial nerve deficit.     Sensory: No sensory deficit.     Motor: No weakness.     Gait: Gait normal.     ED Results / Procedures / Treatments   Labs (all labs ordered are listed, but only abnormal results are displayed) Labs Reviewed - No data to display  EKG None  Radiology No results found.  Procedures Procedures   Medications Ordered in ED Medications  fluorescein  ophthalmic strip 1 strip (1 strip Both Eyes Given 10/29/23 1232)  tetracaine (PONTOCAINE) 0.5 % ophthalmic solution 2 drop (2 drops Both Eyes Given 10/29/23 1232)    ED Course/ Medical Decision Making/ A&P   Medical Decision Making Risk Prescription drug management.   68 y.o. male presents to the ER today for evaluation of left eye vision changes. Differential diagnosis includes but is not limited to retinal detachment, migrainous aura, vitreous hemorrhage, glaucoma. Vital signs mildly elevated BP, otherwise unremarkable. Physical exam as noted above.   Tonopen reading . Patient does have slightly worsening vision on the left, but his visual fields are intact bilaterally. PERRL. EOMI. My attending assessed the patient at bedside with US  and does not see a detachment.  Eye is not painful and there is no trauma, do not see need for fluorescein exam.  I have consulted ophthalmology and spoke with Dr. McCuen. She feels confident that this is an eye issue given that it was monocular. She can see the patient today in the next hour at 1400 in the office.   I discussed this with the patient and he agrees to the plan and reports he will drive straight there. He does not have any vision loss or double vision, I do feel it is appropriate for him to drive. Discussed with him to pull over and call 911 if there were any changes. Attending agrees with plan.   We discussed plan at bedside. We discussed strict return precautions and red flag symptoms. The patient verbalized their understanding and agrees to the plan. The patient is stable and being discharged home in good condition.  I discussed this case with my attending physician who cosigned this note including patient's presenting symptoms, physical exam, and planned diagnostics and interventions. Attending physician stated agreement with plan or made changes to plan which were implemented.   Attending physician assessed patient at  bedside.  Portions of this report may have been transcribed using voice recognition software. Every effort was made to ensure accuracy; however, inadvertent computerized transcription errors may be present.   Final Clinical Impression(s) / ED Diagnoses Final diagnoses:  Vision problem    Rx / DC Orders ED Discharge Orders     None         Spence Dux, PA-C 10/29/23 1626    Almond Army, MD 10/30/23 1739

## 2023-10-29 NOTE — ED Triage Notes (Signed)
 PT states for past 65 hours left eye has had left eye floaters and decreased Periferal vision. Pt complains of seeing spider web through left eye for 3 hours. Pt denies any pain. No blurred vision but can see better out of right out better than left. Pt requesting ophthalmology referral. Pt called oncall doctor and was advised to come to ER

## 2023-10-29 NOTE — ED Notes (Signed)
 Tonopen and woodslamp at the bedside.

## 2023-10-29 NOTE — Discharge Instructions (Signed)
 Please go straight to Dr. Alonna James office. She will see you in the clinic today around 2:00. If you have any concerns, new or worsening symptoms, pull over and call 911.

## 2023-10-30 DIAGNOSIS — H353131 Nonexudative age-related macular degeneration, bilateral, early dry stage: Secondary | ICD-10-CM | POA: Diagnosis not present

## 2023-10-30 DIAGNOSIS — H35363 Drusen (degenerative) of macula, bilateral: Secondary | ICD-10-CM | POA: Diagnosis not present

## 2023-10-30 DIAGNOSIS — H33312 Horseshoe tear of retina without detachment, left eye: Secondary | ICD-10-CM | POA: Diagnosis not present

## 2023-10-30 DIAGNOSIS — H43813 Vitreous degeneration, bilateral: Secondary | ICD-10-CM | POA: Diagnosis not present

## 2023-10-30 DIAGNOSIS — H2513 Age-related nuclear cataract, bilateral: Secondary | ICD-10-CM | POA: Diagnosis not present

## 2023-10-31 DIAGNOSIS — H33312 Horseshoe tear of retina without detachment, left eye: Secondary | ICD-10-CM | POA: Diagnosis not present

## 2023-11-01 DIAGNOSIS — R739 Hyperglycemia, unspecified: Secondary | ICD-10-CM | POA: Diagnosis not present

## 2023-11-01 DIAGNOSIS — D510 Vitamin B12 deficiency anemia due to intrinsic factor deficiency: Secondary | ICD-10-CM | POA: Diagnosis not present

## 2023-11-01 DIAGNOSIS — Z79899 Other long term (current) drug therapy: Secondary | ICD-10-CM | POA: Diagnosis not present

## 2023-11-01 DIAGNOSIS — Z125 Encounter for screening for malignant neoplasm of prostate: Secondary | ICD-10-CM | POA: Diagnosis not present

## 2023-11-01 DIAGNOSIS — E7800 Pure hypercholesterolemia, unspecified: Secondary | ICD-10-CM | POA: Diagnosis not present

## 2023-11-01 DIAGNOSIS — K769 Liver disease, unspecified: Secondary | ICD-10-CM | POA: Diagnosis not present

## 2023-11-05 DIAGNOSIS — R000 Tachycardia, unspecified: Secondary | ICD-10-CM | POA: Diagnosis not present

## 2023-11-05 DIAGNOSIS — Z0000 Encounter for general adult medical examination without abnormal findings: Secondary | ICD-10-CM | POA: Diagnosis not present

## 2023-11-05 DIAGNOSIS — I8500 Esophageal varices without bleeding: Secondary | ICD-10-CM | POA: Diagnosis not present

## 2023-11-05 DIAGNOSIS — K769 Liver disease, unspecified: Secondary | ICD-10-CM | POA: Diagnosis not present

## 2023-11-05 DIAGNOSIS — D696 Thrombocytopenia, unspecified: Secondary | ICD-10-CM | POA: Diagnosis not present

## 2023-11-05 DIAGNOSIS — Z860101 Personal history of adenomatous and serrated colon polyps: Secondary | ICD-10-CM | POA: Diagnosis not present

## 2023-11-05 DIAGNOSIS — F418 Other specified anxiety disorders: Secondary | ICD-10-CM | POA: Diagnosis not present

## 2023-11-05 DIAGNOSIS — N529 Male erectile dysfunction, unspecified: Secondary | ICD-10-CM | POA: Diagnosis not present

## 2023-11-05 DIAGNOSIS — E7800 Pure hypercholesterolemia, unspecified: Secondary | ICD-10-CM | POA: Diagnosis not present

## 2023-11-05 DIAGNOSIS — K429 Umbilical hernia without obstruction or gangrene: Secondary | ICD-10-CM | POA: Diagnosis not present

## 2023-11-05 DIAGNOSIS — H3530 Unspecified macular degeneration: Secondary | ICD-10-CM | POA: Diagnosis not present

## 2023-11-05 DIAGNOSIS — D510 Vitamin B12 deficiency anemia due to intrinsic factor deficiency: Secondary | ICD-10-CM | POA: Diagnosis not present

## 2023-11-06 ENCOUNTER — Ambulatory Visit (HOSPITAL_BASED_OUTPATIENT_CLINIC_OR_DEPARTMENT_OTHER)
Admission: RE | Admit: 2023-11-06 | Discharge: 2023-11-06 | Disposition: A | Discharge status: Home / Self Care | Payer: Self-pay | Source: Ambulatory Visit | Attending: Internal Medicine | Admitting: Internal Medicine

## 2023-11-06 DIAGNOSIS — E7800 Pure hypercholesterolemia, unspecified: Secondary | ICD-10-CM | POA: Insufficient documentation

## 2023-11-08 ENCOUNTER — Ambulatory Visit: Payer: Self-pay | Admitting: Surgery

## 2023-11-08 DIAGNOSIS — K760 Fatty (change of) liver, not elsewhere classified: Secondary | ICD-10-CM | POA: Diagnosis not present

## 2023-11-08 DIAGNOSIS — K429 Umbilical hernia without obstruction or gangrene: Secondary | ICD-10-CM | POA: Diagnosis not present

## 2023-11-08 NOTE — H&P (Signed)
 History of Present Illness: John Fuentes is a 68 y.o. male who is seen today as an office consultation for evaluation of Umbilical Hernia   He has had the hernia for about a year, and in the last few months has had increasing symptoms. He does a lot of heavy lifting at work, and has noticed more frequent pain and discomfort at the hernia with activity. He is usually able to reduce the hernia with manual pressure and massage. He would like to discuss surgery.   He has a history of heavy EtOH use and has known hepatic steatosis. His LFTs have remained normal, however he has mild thrombocytopenia (platelets 99 on most recent labs last month). He follows with [facility] (Dr. Veronda Goody) and was noted to have small esophageal varices on his last EGD in 2023. He has not had any alcohol for at least 6 months.   His only prior abdominal surgery is a left inguinal hernia repair in the 1980s. He is not on any blood thinners and is not a smoker.       Review of Systems: A complete review of systems was obtained from the patient.  I have reviewed this information and discussed as appropriate with the patient.  See HPI as well for other ROS.     Medical History:  Past Medical History Past Medical History: Diagnosis Date  Esophageal varices (CMS/HHS-HCC)    Hyperlipidemia    Liver disease          Problem List There is no problem list on file for this patient.      Past Surgical History Past Surgical History: Procedure Laterality Date  TONSILLECTOMY   1969  HERNIA REPAIR   1987  Laminectomy L4-5   2012       Allergies No Known Allergies     Medications Ordered Prior to Encounter Current Outpatient Medications on File Prior to Visit Medication Sig Dispense Refill  cyanocobalamin (VITAMIN B12) 1000 MCG tablet Take 1,000 mcg by mouth once daily        No current facility-administered medications on file prior to visit.       Family History Family History Problem Relation Age of  Onset  Crohn's disease Mother    Skin cancer Father    Diabetes Brother         Tobacco Use History Social History    Tobacco Use Smoking Status Every Day Smokeless Tobacco Never       Social History Social History    Socioeconomic History  Marital status: Married Tobacco Use  Smoking status: Every Day  Smokeless tobacco: Never Substance and Sexual Activity  Alcohol use: Never  Drug use: Never    Social Drivers of Health    Housing Stability: Unknown (11/08/2023)   Housing Stability Vital Sign    Homeless in the Last Year: No      Objective:   Vitals:   11/08/23 0906 BP: (!) 148/80 Pulse: 75 Temp: 37.1 C (98.7 F) SpO2: 98% Weight: 79.7 kg (175 lb 12.8 oz) Height: 175.3 cm (5\' 9" ) PainSc:   4   Body mass index is 25.96 kg/m.   Physical Exam Vitals reviewed.  Constitutional:      General: He is not in acute distress.    Appearance: Normal appearance.  HENT:     Head: Normocephalic and atraumatic.  Eyes:     General: No scleral icterus.    Conjunctiva/sclera: Conjunctivae normal.  Cardiovascular:     Rate and Rhythm: Normal rate and  regular rhythm.     Heart sounds: No murmur heard. Pulmonary:     Effort: Pulmonary effort is normal. No respiratory distress.     Breath sounds: Normal breath sounds. No wheezing.  Abdominal:     General: There is no distension.     Palpations: Abdomen is soft.     Tenderness: There is no abdominal tenderness.     Comments: Umbilical hernia, soft and reducible, fascial defect approximately 1.5-2cm.   Skin:    General: Skin is warm and dry.     Coloration: Skin is not jaundiced.  Neurological:     General: No focal deficit present.     Mental Status: He is alert and oriented to person, place, and time.          Assessment and Plan:   Assessment Diagnoses and all orders for this visit:   Umbilical hernia without obstruction and without gangrene   Hepatic steatosis     68 yo male with a reducible  but symptomatic umbilical hernia. I reviewed the details of open umbilical hernia repair with mesh placement, including the benefits and the risks of infection and hernia recurrence. We also reviewed the anticipated postoperative recovery course and activity restrictions. His liver disease and associated thrombocytopenia do not put him at prohibitive risk of surgery. He has no signs of ascites on physical exam. He would like to proceed with surgery and will be contacted to schedule an elective surgery date. All questions were answered.   Karleen Overall, MD [facility] Surgery General, Hepatobiliary and Pancreatic Surgery 11/08/23 11:21 AM

## 2023-11-08 NOTE — H&P (View-Only) (Signed)
 History of Present Illness: John Fuentes is a 68 y.o. male who is seen today as an office consultation for evaluation of Umbilical Hernia   He has had the hernia for about a year, and in the last few months has had increasing symptoms. He does a lot of heavy lifting at work, and has noticed more frequent pain and discomfort at the hernia with activity. He is usually able to reduce the hernia with manual pressure and massage. He would like to discuss surgery.   He has a history of heavy EtOH use and has known hepatic steatosis. His LFTs have remained normal, however he has mild thrombocytopenia (platelets 99 on most recent labs last month). He follows with [facility] (Dr. Veronda Goody) and was noted to have small esophageal varices on his last EGD in 2023. He has not had any alcohol for at least 6 months.   His only prior abdominal surgery is a left inguinal hernia repair in the 1980s. He is not on any blood thinners and is not a smoker.       Review of Systems: A complete review of systems was obtained from the patient.  I have reviewed this information and discussed as appropriate with the patient.  See HPI as well for other ROS.     Medical History:  Past Medical History Past Medical History: Diagnosis Date  Esophageal varices (CMS/HHS-HCC)    Hyperlipidemia    Liver disease          Problem List There is no problem list on file for this patient.      Past Surgical History Past Surgical History: Procedure Laterality Date  TONSILLECTOMY   1969  HERNIA REPAIR   1987  Laminectomy L4-5   2012       Allergies No Known Allergies     Medications Ordered Prior to Encounter Current Outpatient Medications on File Prior to Visit Medication Sig Dispense Refill  cyanocobalamin (VITAMIN B12) 1000 MCG tablet Take 1,000 mcg by mouth once daily        No current facility-administered medications on file prior to visit.       Family History Family History Problem Relation Age of  Onset  Crohn's disease Mother    Skin cancer Father    Diabetes Brother         Tobacco Use History Social History    Tobacco Use Smoking Status Every Day Smokeless Tobacco Never       Social History Social History    Socioeconomic History  Marital status: Married Tobacco Use  Smoking status: Every Day  Smokeless tobacco: Never Substance and Sexual Activity  Alcohol use: Never  Drug use: Never    Social Drivers of Health    Housing Stability: Unknown (11/08/2023)   Housing Stability Vital Sign    Homeless in the Last Year: No      Objective:   Vitals:   11/08/23 0906 BP: (!) 148/80 Pulse: 75 Temp: 37.1 C (98.7 F) SpO2: 98% Weight: 79.7 kg (175 lb 12.8 oz) Height: 175.3 cm (5\' 9" ) PainSc:   4   Body mass index is 25.96 kg/m.   Physical Exam Vitals reviewed.  Constitutional:      General: He is not in acute distress.    Appearance: Normal appearance.  HENT:     Head: Normocephalic and atraumatic.  Eyes:     General: No scleral icterus.    Conjunctiva/sclera: Conjunctivae normal.  Cardiovascular:     Rate and Rhythm: Normal rate and  regular rhythm.     Heart sounds: No murmur heard. Pulmonary:     Effort: Pulmonary effort is normal. No respiratory distress.     Breath sounds: Normal breath sounds. No wheezing.  Abdominal:     General: There is no distension.     Palpations: Abdomen is soft.     Tenderness: There is no abdominal tenderness.     Comments: Umbilical hernia, soft and reducible, fascial defect approximately 1.5-2cm.   Skin:    General: Skin is warm and dry.     Coloration: Skin is not jaundiced.  Neurological:     General: No focal deficit present.     Mental Status: He is alert and oriented to person, place, and time.          Assessment and Plan:   Assessment Diagnoses and all orders for this visit:   Umbilical hernia without obstruction and without gangrene   Hepatic steatosis     68 yo male with a reducible  but symptomatic umbilical hernia. I reviewed the details of open umbilical hernia repair with mesh placement, including the benefits and the risks of infection and hernia recurrence. We also reviewed the anticipated postoperative recovery course and activity restrictions. His liver disease and associated thrombocytopenia do not put him at prohibitive risk of surgery. He has no signs of ascites on physical exam. He would like to proceed with surgery and will be contacted to schedule an elective surgery date. All questions were answered.   Karleen Overall, MD [facility] Surgery General, Hepatobiliary and Pancreatic Surgery 11/08/23 11:21 AM

## 2023-11-14 DIAGNOSIS — H33312 Horseshoe tear of retina without detachment, left eye: Secondary | ICD-10-CM | POA: Diagnosis not present

## 2023-11-14 DIAGNOSIS — H35722 Serous detachment of retinal pigment epithelium, left eye: Secondary | ICD-10-CM | POA: Diagnosis not present

## 2023-11-14 DIAGNOSIS — H43813 Vitreous degeneration, bilateral: Secondary | ICD-10-CM | POA: Diagnosis not present

## 2023-11-14 DIAGNOSIS — H35363 Drusen (degenerative) of macula, bilateral: Secondary | ICD-10-CM | POA: Diagnosis not present

## 2023-11-14 DIAGNOSIS — H353131 Nonexudative age-related macular degeneration, bilateral, early dry stage: Secondary | ICD-10-CM | POA: Diagnosis not present

## 2023-11-14 DIAGNOSIS — H33322 Round hole, left eye: Secondary | ICD-10-CM | POA: Diagnosis not present

## 2023-11-15 DIAGNOSIS — H33322 Round hole, left eye: Secondary | ICD-10-CM | POA: Diagnosis not present

## 2023-11-16 NOTE — Pre-Procedure Instructions (Signed)
 Surgical Instructions   Your procedure is scheduled on November 26, 2023. Report to [facility] Main Entrance A at 5:30 A.M., then check in with the Admitting office. Any questions or running late day of surgery: call [phone]  Questions prior to your surgery date: call [phone], Monday-Friday, 8am-4pm. If you experience any cold or flu symptoms such as cough, fever, chills, shortness of breath, etc. between now and your scheduled surgery, please notify us  at the above number.     Remember:  Do not eat after midnight the night before your surgery   You may drink clear liquids until 4:30 AM the morning of your surgery.   Clear liquids allowed are: Water, Non-Citrus Juices (without pulp), Carbonated Beverages, Clear Tea (no milk, honey, etc.), Black Coffee Only (NO MILK, CREAM OR POWDERED CREAMER of any kind), and Gatorade.    Take these medicines the morning of surgery with A SIP OF WATER: NONE   One week prior to surgery, STOP taking any Aspirin (unless otherwise instructed by your surgeon) Aleve, Naproxen, Ibuprofen, Motrin, Advil, Goody's, BC's, all herbal medications, fish oil, and non-prescription vitamins.                     Do NOT Smoke (Tobacco/Vaping) for 24 hours prior to your procedure.  If you use a CPAP at night, you may bring your mask/headgear for your overnight stay.   You will be asked to remove any contacts, glasses, piercing's, hearing aid's, dentures/partials prior to surgery. Please bring cases for these items if needed.    Patients discharged the day of surgery will not be allowed to drive home, and someone needs to stay with them for 24 hours.  SURGICAL WAITING ROOM VISITATION Patients may have no more than 2 support people in the waiting area - these visitors may rotate.   Pre-op nurse will coordinate an appropriate time for 1 ADULT support person, who may not rotate, to accompany patient in pre-op.  Children under the age of 66 must have an adult  with them who is not the patient and must remain in the main waiting area with an adult.  If the patient needs to stay at the hospital during part of their recovery, the visitor guidelines for inpatient rooms apply.  Please refer to the [facility] website for the visitor guidelines for any additional information.   If you received a COVID test during your pre-op visit  it is requested that you wear a mask when out in public, stay away from anyone that may not be feeling well and notify your surgeon if you develop symptoms. If you have been in contact with anyone that has tested positive in the last 10 days please notify you surgeon.      Pre-operative CHG Bathing Instructions   You can play a key role in reducing the risk of infection after surgery. Your skin needs to be as free of germs as possible. You can reduce the number of germs on your skin by washing with CHG (chlorhexidine gluconate) soap before surgery. CHG is an antiseptic soap that kills germs and continues to kill germs even after washing.   DO NOT use if you have an allergy to chlorhexidine/CHG or antibacterial soaps. If your skin becomes reddened or irritated, stop using the CHG and notify one of our RNs at [phone].              TAKE A SHOWER THE NIGHT BEFORE SURGERY AND THE DAY OF SURGERY  Please keep in mind the following:  DO NOT shave, including legs and underarms, 48 hours prior to surgery.   You may shave your face before/day of surgery.  Place clean sheets on your bed the night before surgery Use a clean washcloth (not used since being washed) for each shower. DO NOT sleep with pet's night before surgery.  CHG Shower Instructions:  Wash your face and private area with normal soap. If you choose to wash your hair, wash first with your normal shampoo.  After you use shampoo/soap, rinse your hair and body thoroughly to remove shampoo/soap residue.  Turn the water OFF and apply half the bottle of CHG soap to a  CLEAN washcloth.  Apply CHG soap ONLY FROM YOUR NECK DOWN TO YOUR TOES (washing for 3-5 minutes)  DO NOT use CHG soap on face, private areas, open wounds, or sores.  Pay special attention to the area where your surgery is being performed.  If you are having back surgery, having someone wash your back for you may be helpful. Wait 2 minutes after CHG soap is applied, then you may rinse off the CHG soap.  Pat dry with a clean towel  Put on clean pajamas    Additional instructions for the day of surgery: DO NOT APPLY any lotions, deodorants, cologne, or perfumes.   Do not wear jewelry or makeup Do not wear nail polish, gel polish, artificial nails, or any other type of covering on natural nails (fingers and toes) Do not bring valuables to the hospital. [facility] is not responsible for valuables/personal belongings. Put on clean/comfortable clothes.  Please brush your teeth.  Ask your nurse before applying any prescription medications to the skin.

## 2023-11-19 ENCOUNTER — Encounter (HOSPITAL_COMMUNITY)
Admission: RE | Admit: 2023-11-19 | Discharge: 2023-11-19 | Disposition: A | Discharge status: Home / Self Care | Source: Ambulatory Visit | Attending: Surgery

## 2023-11-19 ENCOUNTER — Other Ambulatory Visit: Payer: Self-pay

## 2023-11-19 ENCOUNTER — Encounter (HOSPITAL_COMMUNITY): Payer: Self-pay

## 2023-11-19 DIAGNOSIS — Z01818 Encounter for other preprocedural examination: Secondary | ICD-10-CM | POA: Insufficient documentation

## 2023-11-19 DIAGNOSIS — I8510 Secondary esophageal varices without bleeding: Secondary | ICD-10-CM | POA: Diagnosis not present

## 2023-11-19 DIAGNOSIS — D696 Thrombocytopenia, unspecified: Secondary | ICD-10-CM | POA: Diagnosis not present

## 2023-11-19 DIAGNOSIS — K760 Fatty (change of) liver, not elsewhere classified: Secondary | ICD-10-CM | POA: Diagnosis not present

## 2023-11-19 HISTORY — DX: Esophageal varices without bleeding: I85.00

## 2023-11-19 HISTORY — DX: Anemia, unspecified: D64.9

## 2023-11-19 HISTORY — DX: Disease of blood and blood-forming organs, unspecified: D75.9

## 2023-11-19 HISTORY — DX: Other psychoactive substance abuse, uncomplicated: F19.10

## 2023-11-19 NOTE — Progress Notes (Signed)
 PCP - Dr. Thermon Flattery with [facility] Cardiologist - Denies  PPM/ICD - Denies Device Orders - n/a Rep Notified - n/a  Chest x-ray - n/a EKG - Denies within past year Stress Test - Denies ECHO - Denies Cardiac Cath - Denies CT Coronary - 11/06/2023  Sleep Study - Denies CPAP - n/a  No DM  Last dose of GLP1 agonist- n/a GLP1 instructions: n/a  Blood Thinner Instructions: n/a Aspirin Instructions: n/a  ERAS Protcol - Clear liquids until 0430 morning of surgery PRE-SURGERY Ensure or G2- n/a  COVID TEST- n/a   Anesthesia review: Yes. 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 Pt quit drinking 06/22/2023, but was drinking 2-3 drinks (beer and dark liquor) daily. He states he see's GI doctor with [facility], but unable to see any notes.    Patient denies shortness of breath, fever, cough and chest pain at PAT appointment. Pt denies any respiratory illness/infection in the last two months.    All instructions explained to the patient, with a verbal understanding of the material. Patient agrees to go over the instructions while at home for a better understanding. Patient also instructed to self quarantine after being tested for COVID-19. The opportunity to ask questions was provided.

## 2023-11-20 ENCOUNTER — Encounter (HOSPITAL_COMMUNITY): Payer: Self-pay

## 2023-11-20 NOTE — Anesthesia Preprocedure Evaluation (Signed)
 Anesthesia Evaluation  Patient identified by MRN, date of birth, ID band Patient awake    Reviewed: Allergy & Precautions, NPO status , Patient's Chart, lab work & pertinent test results  Airway Mallampati: II  TM Distance: >3 FB Neck ROM: Full    Dental  (+) Dental Advisory Given, Chipped,    Pulmonary neg pulmonary ROS   Pulmonary exam normal breath sounds clear to auscultation       Cardiovascular negative cardio ROS Normal cardiovascular exam Rhythm:Regular Rate:Normal     Neuro/Psych negative neurological ROS  negative psych ROS   GI/Hepatic negative GI ROS,,,(+)     substance abuse  alcohol use  Endo/Other  negative endocrine ROS    Renal/GU negative Renal ROS  negative genitourinary   Musculoskeletal negative musculoskeletal ROS (+)    Abdominal   Peds  Hematology negative hematology ROS (+)   Anesthesia Other Findings   Reproductive/Obstetrics                             Anesthesia Physical Anesthesia Plan  ASA: 2  Anesthesia Plan: General   Post-op Pain Management:    Induction: Intravenous  PONV Risk Score and Plan: 2 and Midazolam, Dexamethasone and Ondansetron  Airway Management Planned: Oral ETT  Additional Equipment:   Intra-op Plan:   Post-operative Plan: Extubation in OR  Informed Consent: I have reviewed the patients History and Physical, chart, labs and discussed the procedure including the risks, benefits and alternatives for the proposed anesthesia with the patient or authorized representative who has indicated his/her understanding and acceptance.     Dental advisory given  Plan Discussed with: CRNA  Anesthesia Plan Comments: (Hepatic Panel Reviewed date:11/01/2023 09:01:52 PM Interpretation: Performing Lab: Notes/Report: Testing Performed at: [facility], [address], [place], [place] [address] Protein, Total 6.4 6.0-8.3 g/dL    Albumin 4.1 6.5-5.1 g/dL   ALP 37 61-873 U/L   AST 18 0-39 U/L   ALT 16 0-52 U/L   TBIL 1.0 0.3-1.0 mg/dL   DBIL 0.2 9.9-9.7 mg/dL   IBIL 0.8 9.6-9.2 mg/dL   CBC with Diff Reviewed date:11/01/2023 09:01:52 PM Interpretation: Performing Lab: Notes/Report: Testing Performed at: [facility], [address], [address], [place], [place] [address] WBC 5.9 4.0-11.0 K/ul   RBC 4.58 4.20-5.80 M/uL   HGB 14.0 13.0-17.0 g/dL   HCT 58.8 60.9-47.9 %   MCV 89.6 80.0-94.0 fL   MCH 30.6 27.0-33.0 pg   MCHC 34.2 32.0-36.0 g/dL   RDW 86.1 88.4-84.4 %   PLT 99 150-400 K/uL   MPV 12.2 7.5-10.7 fL   NE% 61.2 43.3-71.9 %   LY% 27.2 16.8-43.5 %   MO% 8.4 4.6-12.4 %   EO% 2.5 0.0-7.8 %   BA% 0.7 0.0-1.0 %   NE# 3.6 1.9-7.2 K/uL   LY# 1.60 1.10-2.70 K/uL   MO# 0.5 0.3-0.8 K/uL   EO# 0.1 0.0-0.6 K/uL   BA# 0.0 0.0-0.1 K/uL   NRBC% 0.10     NRBC# 0.00     Basic Metabolic Reviewed date:11/01/2023 09:01:52 PM Interpretation: Performing Lab: Notes/Report: Testing Performed at: [facility], [address], [place], [place] [address] Glucose 104 70-99 mg/dL   BUN 10 3-73 mg/dL   Creatinine 9.09 9.39-8.69 mg/dl   [id number] 94 >39 calc In accordance with recommendations from NKF-ASN Task Force, [facility] has updated its eGFR calc to the 2021 CKD-EDI equation that estimates kidney function without a race variable;Stage 1 >  90 ML/Min plus Albuminuria;Stage 2 60-89 ML/MIN;Stage 3 30-59 ML/MIN;Stage 4 15-29 ML/MIN;Stage 5 <15 ML/MIN Sodium 142 136-145 mmol/L   Potassium 4.1 3.5-5.5 mmol/L   Chloride 107 98-107 mmol/L   CO2 27 22-32 mmol/L   Anion Gap 12.7 6.0-20.0 mmol/L   Calcium 9.2 8.6-10.3 mg/dL   Albumin 4.1 6.5-5.1 g/dL   CA-corrected 0.91 1.39-89.69 mg/dL   )        Anesthesia Quick Evaluation

## 2023-11-26 ENCOUNTER — Encounter (HOSPITAL_COMMUNITY)
Admission: RE | Disposition: A | Discharge status: Home / Self Care | Payer: Self-pay | Source: Home / Self Care | Attending: Surgery

## 2023-11-26 ENCOUNTER — Other Ambulatory Visit: Payer: Self-pay

## 2023-11-26 ENCOUNTER — Ambulatory Visit (HOSPITAL_COMMUNITY): Payer: Self-pay | Admitting: Physician Assistant

## 2023-11-26 ENCOUNTER — Ambulatory Visit (HOSPITAL_COMMUNITY)
Admission: RE | Admit: 2023-11-26 | Discharge: 2023-11-26 | Disposition: A | Discharge status: Home / Self Care | Payer: Self-pay | Attending: Surgery | Admitting: Surgery

## 2023-11-26 ENCOUNTER — Encounter (HOSPITAL_COMMUNITY): Payer: Self-pay | Admitting: Surgery

## 2023-11-26 ENCOUNTER — Ambulatory Visit (HOSPITAL_COMMUNITY): Payer: Self-pay | Admitting: Anesthesiology

## 2023-11-26 DIAGNOSIS — K760 Fatty (change of) liver, not elsewhere classified: Secondary | ICD-10-CM | POA: Insufficient documentation

## 2023-11-26 DIAGNOSIS — K429 Umbilical hernia without obstruction or gangrene: Secondary | ICD-10-CM | POA: Diagnosis not present

## 2023-11-26 DIAGNOSIS — D696 Thrombocytopenia, unspecified: Secondary | ICD-10-CM | POA: Insufficient documentation

## 2023-11-26 HISTORY — PX: UMBILICAL HERNIA REPAIR: SHX196

## 2023-11-26 SURGERY — REPAIR, HERNIA, UMBILICAL, ADULT
Anesthesia: General

## 2023-11-26 MED ORDER — ONDANSETRON HCL 4 MG/2ML IJ SOLN
INTRAMUSCULAR | Status: AC
  Filled 2023-11-26: qty 2

## 2023-11-26 MED ORDER — LIDOCAINE 2% (20 MG/ML) 5 ML SYRINGE
INTRAMUSCULAR | Status: AC
  Filled 2023-11-26: qty 5

## 2023-11-26 MED ORDER — FENTANYL CITRATE (PF) 100 MCG/2ML IJ SOLN
INTRAMUSCULAR | Status: DC | PRN
  Administered 2023-11-26 (×2): 50 ug via INTRAVENOUS

## 2023-11-26 MED ORDER — OXYCODONE HCL 5 MG PO TABS: 5.0000 mg | ORAL_TABLET | Freq: Once | ORAL | Status: DC | PRN

## 2023-11-26 MED ORDER — ORAL CARE MOUTH RINSE: 15.0000 mL | Freq: Once | OROMUCOSAL | Status: AC

## 2023-11-26 MED ORDER — MIDAZOLAM HCL 2 MG/2ML IJ SOLN
INTRAMUSCULAR | Status: AC
  Filled 2023-11-26: qty 2

## 2023-11-26 MED ORDER — ACETAMINOPHEN 10 MG/ML IV SOLN
INTRAVENOUS | Status: AC
  Filled 2023-11-26: qty 100

## 2023-11-26 MED ORDER — MIDAZOLAM HCL 2 MG/2ML IJ SOLN
INTRAMUSCULAR | Status: DC | PRN
  Administered 2023-11-26: 2 mg via INTRAVENOUS

## 2023-11-26 MED ORDER — FENTANYL CITRATE (PF) 100 MCG/2ML IJ SOLN
INTRAMUSCULAR | Status: AC
  Filled 2023-11-26: qty 4

## 2023-11-26 MED ORDER — HYDROCODONE-ACETAMINOPHEN 5-325 MG PO TABS: 1.0000 | ORAL_TABLET | Freq: Four times a day (QID) | ORAL | 0 refills | Status: AC | PRN

## 2023-11-26 MED ORDER — DEXAMETHASONE SODIUM PHOSPHATE 10 MG/ML IJ SOLN
INTRAMUSCULAR | Status: DC | PRN
  Administered 2023-11-26: 10 mg via INTRAVENOUS

## 2023-11-26 MED ORDER — PROPOFOL 10 MG/ML IV BOLUS
INTRAVENOUS | Status: DC | PRN
  Administered 2023-11-26: 130 mg via INTRAVENOUS

## 2023-11-26 MED ORDER — PROPOFOL 10 MG/ML IV BOLUS
INTRAVENOUS | Status: AC
Start: 2023-11-26 — End: 2023-11-26
  Filled 2023-11-26: qty 20

## 2023-11-26 MED ORDER — KETOROLAC TROMETHAMINE 30 MG/ML IJ SOLN
INTRAMUSCULAR | Status: DC | PRN
Start: 2023-11-26 — End: 2023-11-26
  Administered 2023-11-26: 30 mg via INTRAVENOUS

## 2023-11-26 MED ORDER — ROCURONIUM BROMIDE 10 MG/ML (PF) SYRINGE
PREFILLED_SYRINGE | INTRAVENOUS | Status: DC | PRN
  Administered 2023-11-26: 60 mg via INTRAVENOUS

## 2023-11-26 MED ORDER — LACTATED RINGERS IV SOLN: INTRAVENOUS | Status: DC

## 2023-11-26 MED ORDER — PHENYLEPHRINE 80 MCG/ML (10ML) SYRINGE FOR IV PUSH (FOR BLOOD PRESSURE SUPPORT)
PREFILLED_SYRINGE | INTRAVENOUS | Status: DC | PRN
  Administered 2023-11-26 (×3): 80 ug via INTRAVENOUS

## 2023-11-26 MED ORDER — KETOROLAC TROMETHAMINE 30 MG/ML IJ SOLN
INTRAMUSCULAR | Status: AC
  Filled 2023-11-26: qty 1

## 2023-11-26 MED ORDER — LIDOCAINE 2% (20 MG/ML) 5 ML SYRINGE
INTRAMUSCULAR | Status: DC | PRN
  Administered 2023-11-26: 60 mg via INTRAVENOUS

## 2023-11-26 MED ORDER — DEXAMETHASONE SODIUM PHOSPHATE 10 MG/ML IJ SOLN
INTRAMUSCULAR | Status: AC
  Filled 2023-11-26: qty 1

## 2023-11-26 MED ORDER — KETAMINE HCL 50 MG/5ML IJ SOSY
PREFILLED_SYRINGE | INTRAMUSCULAR | Status: AC
  Filled 2023-11-26: qty 5

## 2023-11-26 MED ORDER — CEFAZOLIN SODIUM-DEXTROSE 2-4 GM/100ML-% IV SOLN
2.0000 g | INTRAVENOUS | Status: AC
  Administered 2023-11-26: 2 g via INTRAVENOUS
  Filled 2023-11-26: qty 100

## 2023-11-26 MED ORDER — AMISULPRIDE (ANTIEMETIC) 5 MG/2ML IV SOLN: 10.0000 mg | Freq: Once | INTRAVENOUS | Status: DC | PRN

## 2023-11-26 MED ORDER — FENTANYL CITRATE (PF) 100 MCG/2ML IJ SOLN: 25.0000 ug | INTRAMUSCULAR | Status: DC | PRN

## 2023-11-26 MED ORDER — ACETAMINOPHEN 10 MG/ML IV SOLN
INTRAVENOUS | Status: DC | PRN
  Administered 2023-11-26: 1000 mg via INTRAVENOUS

## 2023-11-26 MED ORDER — 0.9 % SODIUM CHLORIDE (POUR BTL) OPTIME
TOPICAL | Status: DC | PRN
  Administered 2023-11-26: 1000 mL

## 2023-11-26 MED ORDER — CHLORHEXIDINE GLUCONATE 0.12 % MT SOLN
15.0000 mL | Freq: Once | OROMUCOSAL | Status: AC
  Administered 2023-11-26: 15 mL via OROMUCOSAL
  Filled 2023-11-26: qty 15

## 2023-11-26 MED ORDER — OXYCODONE HCL 5 MG/5ML PO SOLN: 5.0000 mg | Freq: Once | ORAL | Status: DC | PRN

## 2023-11-26 MED ORDER — ROCURONIUM BROMIDE 10 MG/ML (PF) SYRINGE
PREFILLED_SYRINGE | INTRAVENOUS | Status: AC
  Filled 2023-11-26: qty 10

## 2023-11-26 MED ORDER — BUPIVACAINE-EPINEPHRINE (PF) 0.25% -1:200000 IJ SOLN
INTRAMUSCULAR | Status: AC
  Filled 2023-11-26: qty 30

## 2023-11-26 MED ORDER — PHENYLEPHRINE 80 MCG/ML (10ML) SYRINGE FOR IV PUSH (FOR BLOOD PRESSURE SUPPORT)
PREFILLED_SYRINGE | INTRAVENOUS | Status: AC
  Filled 2023-11-26: qty 10

## 2023-11-26 MED ORDER — SUGAMMADEX SODIUM 200 MG/2ML IV SOLN
INTRAVENOUS | Status: DC | PRN
Start: 2023-11-26 — End: 2023-11-26
  Administered 2023-11-26: 200 mg via INTRAVENOUS

## 2023-11-26 MED ORDER — BUPIVACAINE-EPINEPHRINE 0.25% -1:200000 IJ SOLN
INTRAMUSCULAR | Status: DC | PRN
  Administered 2023-11-26: 20 mL
  Administered 2023-11-26: 10 mL

## 2023-11-26 MED ORDER — SODIUM CHLORIDE (PF) 0.9 % IJ SOLN
INTRAMUSCULAR | Status: AC
  Filled 2023-11-26: qty 10

## 2023-11-26 MED ORDER — ONDANSETRON HCL 4 MG/2ML IJ SOLN
INTRAMUSCULAR | Status: DC | PRN
  Administered 2023-11-26: 4 mg via INTRAVENOUS

## 2023-11-26 SURGICAL SUPPLY — 25 items
BENZOIN TINCTURE PRP APPL 2/3 (GAUZE/BANDAGES/DRESSINGS) IMPLANT
CHLORAPREP W/TINT 26 (MISCELLANEOUS) ×2 IMPLANT
COTTONBALL LRG STERILE PKG (GAUZE/BANDAGES/DRESSINGS) IMPLANT
COVER SURGICAL LIGHT HANDLE (MISCELLANEOUS) ×2 IMPLANT
DRAPE LAPAROTOMY 100X72 PEDS (DRAPES) ×2 IMPLANT
DRSG TEGADERM 4X4.5 CHG (GAUZE/BANDAGES/DRESSINGS) IMPLANT
ELECTRODE REM PT RTRN 9FT ADLT (ELECTROSURGICAL) ×2 IMPLANT
GLOVE BIOGEL PI IND STRL 6 (GLOVE) ×2 IMPLANT
GLOVE BIOGEL PI MICRO STRL 5.5 (GLOVE) ×2 IMPLANT
GOWN STRL REUS W/ TWL LRG LVL3 (GOWN DISPOSABLE) ×4 IMPLANT
KIT BASIN OR (CUSTOM PROCEDURE TRAY) ×2 IMPLANT
KIT TURNOVER KIT B (KITS) ×2 IMPLANT
MESH VENTRALEX ST 2.5 CRC MED (Mesh General) IMPLANT
NDL HYPO 25GX1X1/2 BEV (NEEDLE) ×2 IMPLANT
NEEDLE HYPO 25GX1X1/2 BEV (NEEDLE) ×1 IMPLANT
NS IRRIG 1000ML POUR BTL (IV SOLUTION) ×2 IMPLANT
PACK GENERAL/GYN (CUSTOM PROCEDURE TRAY) ×2 IMPLANT
PAD ARMBOARD POSITIONER FOAM (MISCELLANEOUS) ×2 IMPLANT
STRIP CLOSURE SKIN 1/2X4 (GAUZE/BANDAGES/DRESSINGS) IMPLANT
SUT MNCRL AB 4-0 PS2 18 (SUTURE) ×2 IMPLANT
SUT NOVA NAB DX-16 0-1 5-0 T12 (SUTURE) ×2 IMPLANT
SUT NOVA NAB GS-21 0 18 T12 DT (SUTURE) ×2 IMPLANT
SUT VIC AB 3-0 SH 27X BRD (SUTURE) ×2 IMPLANT
SYR CONTROL 10ML LL (SYRINGE) ×2 IMPLANT
TOWEL GREEN STERILE FF (TOWEL DISPOSABLE) ×2 IMPLANT

## 2023-11-26 NOTE — Discharge Instructions (Addendum)
 [facility] DISCHARGE INSTRUCTIONS  Activity No heavy lifting greater than 15 pounds for 8 weeks after surgery. Ok to shower in 48 hours after your dressing has been removed, but do not bathe or submerge incision underwater for 2 weeks. Do not drive while taking narcotic pain medication. You may drive when you are no longer taking prescription pain medication, you can comfortably wear a seatbelt, and you can safely maneuver your car and apply brakes.  Wound Care Your incision is covered with a cotton dressing - you should remove this dressing in 48 hours. There are white strips under the dressing, which you should leave in place - these will fall off on their own after about 1-2 weeks. You may shower and allow warm soapy water to run over your incision in 48 hours after you have removed the dressing. Do not submerge your incision underwater for at least 2 weeks. Monitor your incision for any new redness, tenderness, or drainage. Many patients will experience some swelling and bruising at the incisions.  Ice packs will help.  Swelling and bruising can take several days to resolve.   Medications A  prescription for pain medication may be given to you upon discharge.  Take your pain medication as prescribed, if needed.  If narcotic pain medicine is not needed, then you may take acetaminophen (Tylenol) or ibuprofen (Advil) as needed. It is common to experience some constipation if taking pain medication after surgery.  Increasing fluid intake and taking a stool softener (such as Colace) will usually help or prevent this problem from occurring.  A mild laxative (Milk of Magnesia or Miralax) should be taken according to package directions if there are no bowel movements after 48 hours. Take your usually prescribed medications unless otherwise directed. If you need a refill on your pain medication, please contact your pharmacy.  They will contact our office to request authorization.  Prescriptions will not be filled after 5 pm or on weekends.  When to Call Us : Fever greater than 100.5 New redness, drainage, or swelling at incision site Severe pain, nausea, or vomiting Persistent bleeding from incisions  Follow-up You have an appointment scheduled with Dr. Dasie on July 15 at 9:30am. This will be at the [facility] office at [address], [address], [place], [place]. Please arrive at least 15 minutes prior to your scheduled appointment time.  IF YOU HAVE DISABILITY OR FAMILY LEAVE FORMS, YOU MUST BRING THEM TO THE OFFICE FOR PROCESSING.   DO NOT GIVE THEM TO YOUR DOCTOR.  The clinic staff is available to answer your questions during regular business hours.  Please don't hesitate to call and ask to speak to one of the nurses for clinical concerns.  If you have a medical emergency, go to the nearest emergency room or call 911.  A surgeon from [facility] is always on call at the hospital  [address], [place], [place]  [address] ?  [address] [address], [place], [place]   [address] [phone] ? Toll Free: [phone] ? FAX [phone] Web site: [email or web address]      Managing Your Pain After Surgery Without Opioids    Thank you for participating in our program to help patients manage their pain after surgery without opioids. This is part of our effort to provide you with the best care possible, without exposing you or your family to the risk that opioids pose.  What pain can I expect after surgery? You can  expect to have some pain after surgery. This is normal. The pain is typically worse the day after surgery, and quickly begins to get better. Many studies have found that many patients are able to manage their pain after surgery with Over-the-Counter (OTC) medications such as Tylenol and Motrin. If you have a condition that does not allow you to take Tylenol or Motrin, notify your surgical team.  How will  I manage my pain? The best strategy for controlling your pain after surgery is around the clock pain control with Tylenol (acetaminophen) and Motrin (ibuprofen or Advil). Alternating these medications with each other allows you to maximize your pain control. In addition to Tylenol and Motrin, you can use heating pads or ice packs on your incisions to help reduce your pain.  How will I alternate your regular strength over-the-counter pain medication? You will take a dose of pain medication every three hours. Start by taking 650 mg of Tylenol (2 pills of 325 mg) 3 hours later take 600 mg of Motrin (3 pills of 200 mg) 3 hours after taking the Motrin take 650 mg of Tylenol 3 hours after that take 600 mg of Motrin.   - 1 -  See example - if your first dose of Tylenol is at 12:00 PM   12:00 PM Tylenol 650 mg (2 pills of 325 mg)  3:00 PM Motrin 600 mg (3 pills of 200 mg)  6:00 PM Tylenol 650 mg (2 pills of 325 mg)  9:00 PM Motrin 600 mg (3 pills of 200 mg)  Continue alternating every 3 hours   We recommend that you follow this schedule around-the-clock for at least 3 days after surgery, or until you feel that it is no longer needed. Use the table on the last page of this handout to keep track of the medications you are taking. Important: Do not take more than 3000mg  of Tylenol or 3200mg  of Motrin in a 24-hour period. Do not take ibuprofen/Motrin if you have a history of bleeding stomach ulcers, severe kidney disease, &/or actively taking a blood thinner  What if I still have pain? If you have pain that is not controlled with the over-the-counter pain medications (Tylenol and Motrin or Advil) you might have what we call "breakthrough" pain. You will receive a prescription for a small amount of an opioid pain medication such as Oxycodone, Tramadol, or Tylenol with Codeine. Use these opioid pills in the first 24 hours after surgery if you have breakthrough pain. Do not take more than 1 pill every  4-6 hours.  If you still have uncontrolled pain after using all opioid pills, don't hesitate to call our staff using the number provided. We will help make sure you are managing your pain in the best way possible, and if necessary, we can provide a prescription for additional pain medication.   Day 1    Time  Name of Medication Number of pills taken  Amount of Acetaminophen  Pain Level   Comments  AM PM       AM PM       AM PM       AM PM       AM PM       AM PM       AM PM       AM PM       Total Daily amount of Acetaminophen Do not take more than  3,000 mg per day      Day 2  Time  Name of Medication Number of pills taken  Amount of Acetaminophen  Pain Level   Comments  AM PM       AM PM       AM PM       AM PM       AM PM       AM PM       AM PM       AM PM       Total Daily amount of Acetaminophen Do not take more than  3,000 mg per day      Day 3    Time  Name of Medication Number of pills taken  Amount of Acetaminophen  Pain Level   Comments  AM PM       AM PM       AM PM       AM PM         AM PM       AM PM       AM PM       AM PM       Total Daily amount of Acetaminophen Do not take more than  3,000 mg per day      Day 4    Time  Name of Medication Number of pills taken  Amount of Acetaminophen  Pain Level   Comments  AM PM       AM PM       AM PM       AM PM       AM PM       AM PM       AM PM       AM PM       Total Daily amount of Acetaminophen Do not take more than  3,000 mg per day      Day 5    Time  Name of Medication Number of pills taken  Amount of Acetaminophen  Pain Level   Comments  AM PM       AM PM       AM PM       AM PM       AM PM       AM PM       AM PM       AM PM       Total Daily amount of Acetaminophen Do not take more than  3,000 mg per day      Day 6    Time  Name of Medication Number of pills taken  Amount of Acetaminophen  Pain Level  Comments  AM PM       AM PM        AM PM       AM PM       AM PM       AM PM       AM PM       AM PM       Total Daily amount of Acetaminophen Do not take more than  3,000 mg per day      Day 7    Time  Name of Medication Number of pills taken  Amount of Acetaminophen  Pain Level   Comments  AM PM       AM PM       AM PM       AM PM       AM  PM       AM PM       AM PM       AM PM       Total Daily amount of Acetaminophen Do not take more than  3,000 mg per day        For additional information about how and where to safely dispose of unused opioid medications - [email or web address]  Disclaimer: This document contains information and/or instructional materials adapted from [facility] for the typical patient with your condition. It does not replace medical advice from your health care provider because your experience may differ from that of the typical patient. Talk to your health care provider if you have any questions about this document, your condition or your treatment plan. Adapted from [facility]

## 2023-11-26 NOTE — Interval H&P Note (Signed)
 History and Physical Interval Note:  11/26/2023 7:15 AM  John Fuentes  has presented today for surgery, with the diagnosis of UMBILICAL HERNIA.  The various methods of treatment have been discussed with the patient and family. After consideration of risks, benefits and other options for treatment, the patient has consented to  Procedure(s) with comments: REPAIR, HERNIA, UMBILICAL, ADULT (N/A) - MESH as a surgical intervention.  The patient's history has been reviewed, patient examined, no change in status, stable for surgery.  I have reviewed the patient's chart and labs.  Questions were answered to the patient's satisfaction.     Leonor LITTIE Dawn

## 2023-11-26 NOTE — Anesthesia Procedure Notes (Signed)
 Procedure Name: Intubation Date/Time: 11/26/2023 7:47 AM  Performed by: Viviana Almarie DASEN, CRNAPre-anesthesia Checklist: Patient identified, Emergency Drugs available, Suction available and Patient being monitored Patient Re-evaluated:Patient Re-evaluated prior to induction Oxygen Delivery Method: Circle system utilized Preoxygenation: Pre-oxygenation with 100% oxygen Induction Type: IV induction Ventilation: Mask ventilation without difficulty Laryngoscope Size: Mac and 3 Grade View: Grade I Tube type: Oral Number of attempts: 1 Airway Equipment and Method: Stylet, Oral airway and Bite block Placement Confirmation: ETT inserted through vocal cords under direct vision, positive ETCO2 and breath sounds checked- equal and bilateral Secured at: 22 cm Tube secured with: Tape Dental Injury: Teeth and Oropharynx as per pre-operative assessment

## 2023-11-26 NOTE — Transfer of Care (Signed)
 Immediate Anesthesia Transfer of Care Note  Patient: John Fuentes  Procedure(s) Performed: REPAIR, HERNIA, UMBILICAL, ADULT  Patient Location: PACU  Anesthesia Type:General  Level of Consciousness: awake, alert , oriented, and patient cooperative  Airway & Oxygen Therapy: Patient Spontanous Breathing  Post-op Assessment: Report given to RN, Post -op Vital signs reviewed and stable, Patient moving all extremities X 4, and Patient able to stick tongue midline  Post vital signs: Reviewed and stable  Last Vitals:  Vitals Value Taken Time  BP 137/91 11/26/23 08:54  Temp 97.8   Pulse 93 11/26/23 08:56  Resp 12 11/26/23 08:56  SpO2 98 % 11/26/23 08:56  Vitals shown include unfiled device data.  Last Pain:  Vitals:   11/26/23 0614  TempSrc:   PainSc: 0-No pain         Complications: No notable events documented.

## 2023-11-26 NOTE — Op Note (Signed)
 Date: 11/26/23  Patient: John Fuentes MRN: [id number]  Preoperative Diagnosis: Umbilical hernia Postoperative Diagnosis: Same  Procedure: Open umbilical hernia repair with intraperitoneal mesh underlay  Surgeon: Leonor Dawn, MD Assistant: Mae Flock, MD (Resident)  EBL: Minimal  Anesthesia: General  Specimens: None  Indications: [name] is a 68 yo male who was referred with an umbilical hernia. The hernia was reducible but symptomatic. After a discussion of the risks and benefits of surgery, he agreed to proceed with repair.  Findings: Umbilical hernia containing viable omentum, with a fascial defect of 2.5x1cm after opening the sac. The hernia was repaired with a 6cm Ventralex mesh.  Procedure details: Informed consent was obtained in the preoperative area prior to the procedure. The patient was brought to the operating room and placed on the table in the supine position. General anesthesia was induced and appropriate lines and drains were placed for intraoperative monitoring. Perioperative antibiotics were administered per SCIP guidelines. The abdomen was prepped and draped in the usual sterile fashion. A pre-procedure timeout was taken verifying patient identity, surgical site and procedure to be performed.  A curvilinear infraumbilical skin incision was made and the subcutaneous tissue was divided with cautery down to the fascia. The umbilical hernia sac was visualized and circumferentially dissected out using blunt dissection.  The overlying umbilical skin was sharply taken off the hernia sac. The sac was opened and contained viable omentum, which was adherent to the sac. The omentum was separated from the sac using cautery and completely reduced back into the abdomen. The hernia sac was then circumferentially dissected off the fascia using cautery and discarded. Subcutaneous flaps were raised circumferentially around the fascial opening to allow adequate room the secure the mesh. The  hernia defect was measured at 2.5cm in length by 1cm in width. A 6cm circular piece of Ventralex mesh was brought onto the field. The mesh was secured intraperitoneally to the fascia using 0 Novafil transfascial sutures, placed at the four cardinal points. The sutures were tied down, which brought the mesh flush with the abdominal wall. The mesh edges were palpated prior to tying the sutures to ensure no bowel or omentum was between the mesh and abdominal wall. The fascial defect was then closed transversely over the mesh using interrupted 1 Novafil sutures. The tabs of the mesh were incorporated in the closure to pull the center of the mesh flush with the abdominal wall. The excess tabs of the mesh were then cut. The wound appeared hemostatic. There was now excess umbilical skin, which was sharply excised. The umbilical skin was then tacked down to the fascia using a 3-0 Vicryl suture. The deep dermis was reapproximated with 3-0 Vicryl sutures. The subcuticular layer was closed with a running 4-0 monocryl suture. Steristrips and a pressure dressing were applied.  The patient tolerated the procedure well with no apparent complications. All counts were correct x2 at the end of the procedure. The patient was extubated and taken to PACU in stable condition.  Leonor Dawn, MD 11/26/23 8:48 AM

## 2023-11-27 ENCOUNTER — Encounter (HOSPITAL_COMMUNITY): Payer: Self-pay | Admitting: Surgery

## 2023-11-27 NOTE — Anesthesia Postprocedure Evaluation (Signed)
 Anesthesia Post Note  Patient: Energy manager  Procedure(s) Performed: REPAIR, HERNIA, UMBILICAL, ADULT     Patient location during evaluation: PACU Anesthesia Type: General Level of consciousness: awake and alert Pain management: pain level controlled Vital Signs Assessment: post-procedure vital signs reviewed and stable Respiratory status: spontaneous breathing, nonlabored ventilation, respiratory function stable and patient connected to nasal cannula oxygen Cardiovascular status: blood pressure returned to baseline and stable Postop Assessment: no apparent nausea or vomiting Anesthetic complications: no  No notable events documented.  Last Vitals:  Vitals:   11/26/23 0915 11/26/23 0924  BP: 135/76 132/80  Pulse: 81 79  Resp: 16 16  Temp:  36.6 C  SpO2: 96% 98%    Last Pain:  Vitals:   11/26/23 0924  TempSrc:   PainSc: 0-No pain   Pain Goal:                   John Fuentes

## 2023-12-18 DIAGNOSIS — K429 Umbilical hernia without obstruction or gangrene: Secondary | ICD-10-CM | POA: Diagnosis not present

## 2023-12-19 DIAGNOSIS — H2513 Age-related nuclear cataract, bilateral: Secondary | ICD-10-CM | POA: Diagnosis not present

## 2023-12-19 DIAGNOSIS — H353131 Nonexudative age-related macular degeneration, bilateral, early dry stage: Secondary | ICD-10-CM | POA: Diagnosis not present

## 2023-12-19 DIAGNOSIS — H5213 Myopia, bilateral: Secondary | ICD-10-CM | POA: Diagnosis not present

## 2023-12-24 DIAGNOSIS — N281 Cyst of kidney, acquired: Secondary | ICD-10-CM | POA: Diagnosis not present

## 2023-12-24 DIAGNOSIS — K760 Fatty (change of) liver, not elsewhere classified: Secondary | ICD-10-CM | POA: Diagnosis not present

## 2024-02-12 DIAGNOSIS — H2513 Age-related nuclear cataract, bilateral: Secondary | ICD-10-CM | POA: Diagnosis not present

## 2024-02-12 DIAGNOSIS — H3554 Dystrophies primarily involving the retinal pigment epithelium: Secondary | ICD-10-CM | POA: Diagnosis not present

## 2024-02-12 DIAGNOSIS — H3581 Retinal edema: Secondary | ICD-10-CM | POA: Diagnosis not present

## 2024-02-12 DIAGNOSIS — H33312 Horseshoe tear of retina without detachment, left eye: Secondary | ICD-10-CM | POA: Diagnosis not present

## 2024-04-23 DIAGNOSIS — H2513 Age-related nuclear cataract, bilateral: Secondary | ICD-10-CM | POA: Diagnosis not present

## 2024-04-23 DIAGNOSIS — H33312 Horseshoe tear of retina without detachment, left eye: Secondary | ICD-10-CM | POA: Diagnosis not present

## 2024-04-23 DIAGNOSIS — H3554 Dystrophies primarily involving the retinal pigment epithelium: Secondary | ICD-10-CM | POA: Diagnosis not present
